# Patient Record
Sex: Male | Born: 1940 | Race: White | Hispanic: No | Marital: Married
Health system: Southern US, Community
[De-identification: ages and names within clinical notes are randomized; demographics above are authoritative.]

## PROBLEM LIST (undated history)

## (undated) DIAGNOSIS — I10 Essential (primary) hypertension: Secondary | ICD-10-CM

## (undated) DIAGNOSIS — G20A1 Parkinson's disease without dyskinesia, without mention of fluctuations: Secondary | ICD-10-CM

## (undated) DIAGNOSIS — I4891 Unspecified atrial fibrillation: Secondary | ICD-10-CM

## (undated) DIAGNOSIS — R7989 Other specified abnormal findings of blood chemistry: Secondary | ICD-10-CM

## (undated) DIAGNOSIS — G40909 Epilepsy, unspecified, not intractable, without status epilepticus: Secondary | ICD-10-CM

## (undated) DIAGNOSIS — I5020 Unspecified systolic (congestive) heart failure: Secondary | ICD-10-CM

## (undated) DIAGNOSIS — N1832 Chronic kidney disease, stage 3b: Secondary | ICD-10-CM

## (undated) DIAGNOSIS — I2510 Atherosclerotic heart disease of native coronary artery without angina pectoris: Secondary | ICD-10-CM

## (undated) HISTORY — PX: CORONARY ARTERY BYPASS GRAFT: SHX141

---

## 2019-12-05 DIAGNOSIS — I5020 Unspecified systolic (congestive) heart failure: Secondary | ICD-10-CM | POA: Diagnosis present

## 2019-12-05 DIAGNOSIS — I480 Paroxysmal atrial fibrillation: Secondary | ICD-10-CM | POA: Diagnosis present

## 2019-12-05 DIAGNOSIS — G40909 Epilepsy, unspecified, not intractable, without status epilepticus: Secondary | ICD-10-CM

## 2019-12-05 DIAGNOSIS — Z95810 Presence of automatic (implantable) cardiac defibrillator: Secondary | ICD-10-CM | POA: Diagnosis present

## 2019-12-05 DIAGNOSIS — D469 Myelodysplastic syndrome, unspecified: Secondary | ICD-10-CM | POA: Insufficient documentation

## 2019-12-05 DIAGNOSIS — I2510 Atherosclerotic heart disease of native coronary artery without angina pectoris: Secondary | ICD-10-CM | POA: Insufficient documentation

## 2019-12-05 DIAGNOSIS — J449 Chronic obstructive pulmonary disease, unspecified: Secondary | ICD-10-CM | POA: Insufficient documentation

## 2019-12-15 DIAGNOSIS — N2889 Other specified disorders of kidney and ureter: Secondary | ICD-10-CM | POA: Diagnosis present

## 2019-12-15 DIAGNOSIS — N1832 Chronic kidney disease, stage 3b: Secondary | ICD-10-CM | POA: Diagnosis present

## 2019-12-28 ENCOUNTER — Other Ambulatory Visit: Payer: Self-pay

## 2019-12-28 ENCOUNTER — Encounter (HOSPITAL_COMMUNITY): Payer: Self-pay | Admitting: Emergency Medicine

## 2019-12-28 ENCOUNTER — Emergency Department (HOSPITAL_COMMUNITY): Payer: Medicare Other

## 2019-12-28 ENCOUNTER — Emergency Department (HOSPITAL_COMMUNITY)
Admission: EM | Admit: 2019-12-28 | Discharge: 2019-12-28 | Disposition: A | Discharge status: Home / Self Care | Payer: Medicare Other | Attending: Emergency Medicine | Admitting: Emergency Medicine

## 2019-12-28 DIAGNOSIS — J449 Chronic obstructive pulmonary disease, unspecified: Secondary | ICD-10-CM | POA: Insufficient documentation

## 2019-12-28 DIAGNOSIS — I509 Heart failure, unspecified: Secondary | ICD-10-CM | POA: Insufficient documentation

## 2019-12-28 DIAGNOSIS — Z951 Presence of aortocoronary bypass graft: Secondary | ICD-10-CM | POA: Diagnosis not present

## 2019-12-28 DIAGNOSIS — Z20822 Contact with and (suspected) exposure to covid-19: Secondary | ICD-10-CM | POA: Insufficient documentation

## 2019-12-28 DIAGNOSIS — R0602 Shortness of breath: Secondary | ICD-10-CM | POA: Diagnosis not present

## 2019-12-28 DIAGNOSIS — I2510 Atherosclerotic heart disease of native coronary artery without angina pectoris: Secondary | ICD-10-CM | POA: Diagnosis not present

## 2019-12-28 DIAGNOSIS — N189 Chronic kidney disease, unspecified: Secondary | ICD-10-CM | POA: Insufficient documentation

## 2019-12-28 DIAGNOSIS — I480 Paroxysmal atrial fibrillation: Secondary | ICD-10-CM | POA: Diagnosis not present

## 2019-12-28 DIAGNOSIS — Z7901 Long term (current) use of anticoagulants: Secondary | ICD-10-CM | POA: Diagnosis not present

## 2019-12-28 DIAGNOSIS — R079 Chest pain, unspecified: Secondary | ICD-10-CM

## 2019-12-28 LAB — RESPIRATORY PANEL BY RT PCR (FLU A&B, COVID)
Influenza A by PCR: NEGATIVE
Influenza B by PCR: NEGATIVE
SARS Coronavirus 2 by RT PCR: NEGATIVE

## 2019-12-28 LAB — I-STAT CHEM 8, ED
BUN: 24 mg/dL — ABNORMAL HIGH (ref 8–23)
Calcium, Ion: 1.11 mmol/L — ABNORMAL LOW (ref 1.15–1.40)
Chloride: 105 mmol/L (ref 98–111)
Creatinine, Ser: 1.4 mg/dL — ABNORMAL HIGH (ref 0.61–1.24)
Glucose, Bld: 104 mg/dL — ABNORMAL HIGH (ref 70–99)
HCT: 41 % (ref 39.0–52.0)
Hemoglobin: 13.9 g/dL (ref 13.0–17.0)
Potassium: 4.6 mmol/L (ref 3.5–5.1)
Sodium: 140 mmol/L (ref 135–145)
TCO2: 26 mmol/L (ref 22–32)

## 2019-12-28 LAB — CBC WITH DIFFERENTIAL/PLATELET
Abs Immature Granulocytes: 0.03 10*3/uL (ref 0.00–0.07)
Basophils Absolute: 0 10*3/uL (ref 0.0–0.1)
Basophils Relative: 0 %
Eosinophils Absolute: 0.1 10*3/uL (ref 0.0–0.5)
Eosinophils Relative: 1 %
HCT: 40.6 % (ref 39.0–52.0)
Hemoglobin: 13.3 g/dL (ref 13.0–17.0)
Immature Granulocytes: 0 %
Lymphocytes Relative: 11 %
Lymphs Abs: 0.8 10*3/uL (ref 0.7–4.0)
MCH: 34.5 pg — ABNORMAL HIGH (ref 26.0–34.0)
MCHC: 32.8 g/dL (ref 30.0–36.0)
MCV: 105.2 fL — ABNORMAL HIGH (ref 80.0–100.0)
Monocytes Absolute: 0.6 10*3/uL (ref 0.1–1.0)
Monocytes Relative: 8 %
Neutro Abs: 6.2 10*3/uL (ref 1.7–7.7)
Neutrophils Relative %: 80 %
Platelets: 111 10*3/uL — ABNORMAL LOW (ref 150–400)
RBC: 3.86 MIL/uL — ABNORMAL LOW (ref 4.22–5.81)
RDW: 12.2 % (ref 11.5–15.5)
WBC: 7.8 10*3/uL (ref 4.0–10.5)
nRBC: 0 % (ref 0.0–0.2)

## 2019-12-28 LAB — TROPONIN I (HIGH SENSITIVITY)
Troponin I (High Sensitivity): 8 ng/L (ref <18)
Troponin I (High Sensitivity): 8 ng/L (ref <18)

## 2019-12-28 NOTE — ED Provider Notes (Signed)
[facility] EMERGENCY DEPARTMENT Provider Note   CSN: [id number] Arrival date & time: 12/28/19  8502     History Chief Complaint  Patient presents with   Chest Pain    Casey Osborne is a 79 y.o. male.  The history is provided by the patient and medical records.  Chest Pain   79 y.o. M with history of PAF on xarelto, CHF, ICD in place ([employer]), CAD s/p CABG x10 and stenting x2, COPD, CKD, MDS, presenting to the ED for chest pain.  States he started last night around 10:30 PM, described it as more of a "discomfort".  Initially it was central in nature but then began radiating up towards left shoulder and into the left side of the neck.  He reports some associated shortness of breath, nausea and feeling weak.  States he has taken SL NTG with some mild improvement.  Patient states he does not currently have a cardiologist in [place], just moved here from [place] 1 month ago. He did make an appt with cardiologist at [facility] but cannot see them until next month.  He stated LHC in 2013 which resulted in the 2 new stents.  He is not a smoker.  No cough, fever, recent illness.  Has been fully vaccinated for covid with booster.  History reviewed. No pertinent past medical history.  There are no problems to display for this patient.   History reviewed. No pertinent surgical history.     History reviewed. No pertinent family history.  Social History   Tobacco Use   Smoking status: Not on file  Substance Use Topics   Alcohol use: Not on file   Drug use: Not on file    Home Medications Prior to Admission medications   Not on File    Allergies    Ciprofloxacin  Review of Systems   Review of Systems  Cardiovascular: Positive for chest pain.  All other systems reviewed and are negative.   Physical Exam Updated Vital Signs BP 106/68 (BP Location: Right Arm)    Pulse 70    Temp 98.1 F (36.7 C) (Oral)    Resp 16    Ht 6' (1.829 m)    Wt 76.7 kg    SpO2  100%    BMI 22.92 kg/m   Physical Exam Vitals and nursing note reviewed.  Constitutional:      Appearance: He is well-developed.     Comments: Elderly, NAD  HENT:     Head: Normocephalic and atraumatic.  Eyes:     Conjunctiva/sclera: Conjunctivae normal.     Pupils: Pupils are equal, round, and reactive to light.  Cardiovascular:     Rate and Rhythm: Normal rate and regular rhythm.     Heart sounds: Normal heart sounds.  Pulmonary:     Effort: Pulmonary effort is normal.     Breath sounds: Normal breath sounds. No wheezing or rhonchi.  Chest:     Comments: Midline sternotomy scar ICD right chest Abdominal:     General: Bowel sounds are normal.     Palpations: Abdomen is soft.  Musculoskeletal:        General: Normal range of motion.     Cervical back: Normal range of motion.  Skin:    General: Skin is warm and dry.  Neurological:     Mental Status: He is alert and oriented to person, place, and time.     ED Results / Procedures / Treatments   Labs (all labs ordered  are listed, but only abnormal results are displayed) Labs Reviewed  CBC WITH DIFFERENTIAL/PLATELET - Abnormal; Notable for the following components:      Result Value   RBC 3.86 (*)    MCV 105.2 (*)    MCH 34.5 (*)    Platelets 111 (*)    All other components within normal limits  I-STAT CHEM 8, ED - Abnormal; Notable for the following components:   BUN 24 (*)    Creatinine, Ser 1.40 (*)    Glucose, Bld 104 (*)    Calcium, Ion 1.11 (*)    All other components within normal limits  RESPIRATORY PANEL BY RT PCR (FLU A&B, COVID)  TROPONIN I (HIGH SENSITIVITY)  TROPONIN I (HIGH SENSITIVITY)    EKG EKG Interpretation  Date/Time:  Wednesday December 28 2019 04:39:15 EST Ventricular Rate:  70 PR Interval:    QRS Duration: 197 QT Interval:  486 QTC Calculation: 525 R Axis:   -71 Text Interpretation: Accelerated junctional rhythm pacerspikes Nonspecific IVCD with LAD LVH with secondary  repolarization abnormality Confirmed by Randal Buba, April ([id number]) on 12/28/2019 4:50:07 AM   Radiology DG Chest Portable 1 View  Result Date: 12/28/2019 CLINICAL DATA:  Chest pain EXAM: PORTABLE CHEST 1 VIEW COMPARISON:  None. FINDINGS: Cardiomegaly and aortic tortuosity. There is a dual-chamber ICD from the right. Probable CABG. There is no edema, consolidation, effusion, or pneumothorax. IMPRESSION: No evidence of active disease. Electronically Signed   By: Monte Fantasia M.D.   On: 12/28/2019 05:17    Procedures Procedures (including critical care time)  Medications Ordered in ED Medications - No data to display  ED Course  I have reviewed the triage vital signs and the nursing notes.  Pertinent labs & imaging results that were available during my care of the patient were reviewed by me and considered in my medical decision making (see chart for details).    MDM Rules/Calculators/A&P  79 y.o. M with hx of CABG x10 and stenting x2 in [place] here with chest pain.  Began last evening around 10:30PM, improved with NTG.  States it was not really pain just "discomfort".  Due to see cardiology at [facility] next month.  EKG with paced rhythm, some LVH noted.  No prior for comparison.  Labs pending.  Patient HD stable at this time.    5:50 AM Initial trop is negative at 8.  CXR clear.  VSS. Patient states he is feeling better--- states history of vasospasm before and thinks that may be what happened today.  He is feeling much better now and appears comfortable.  Given his extensive cardiac history, discussed admission for chest pain rule out.  Patient is somewhat hesitant about this as he was preferring to see cardiologist that he is scheduled with at [facility].  He is at least agreeable to stay for delta trop and reassessment so we will plan for that and to monitor closely.  Wife at bedside, she is a former [employer] and is comfortable with this plan.  Care will be signed out to oncoming provider to  follow-up on delta troponin and reassess.  Final Clinical Impression(s) / ED Diagnoses Final diagnoses:  Chest pain in adult    Rx / DC Orders ED Discharge Orders    None       Larene Pickett, PA-C 12/28/19 0631    Palumbo, April, MD 12/28/19 [phone]

## 2019-12-28 NOTE — ED Provider Notes (Signed)
Care assumed from Quincy Carnes, PA-C at shift change with delta troponin pending.   In brief, this patient is a 79 y.o. M with PMH/o PAF (currently on Xarelto), CHF, ICD, CAD s/p CABG and stenting, COPD who presents for evaluation of chest pain that began last night around 10:30 pm. He states it felt like a "discomfort." He had some mild associated SOB and generalized weakness sensation. He took SL NTG at home with minimal improvement. He just moved here from [place] and has arranged for cardiology follow up in [place] but has not yet seen them. Please see note from previous provider for full history/physical exam.     Arranged [facility]  Cp last night ... discomfort  Feels fine now  Cardiac history ... bypass and stents  Doesn't want to be admitted  Delta trop  If trop is positive    Physical Exam  BP 96/63   Pulse 67   Temp 98.1 F (36.7 C) (Oral)   Resp 17   Ht 6' (1.829 m)   Wt 76.7 kg   SpO2 99%   BMI 22.92 kg/m   Physical Exam   NAD, Sitting on bed comfortably   ED Course/Procedures     Procedures  Results for orders placed or performed during the hospital encounter of 12/28/19 (from the past 24 hour(s))  CBC with Differential/Platelet     Status: Abnormal   Collection Time: 12/28/19  4:52 AM  Result Value Ref Range   WBC 7.8 4.0 - 10.5 K/uL   RBC 3.86 (L) 4.22 - 5.81 MIL/uL   Hemoglobin 13.3 13.0 - 17.0 g/dL   HCT 40.6 39 - 52 %   MCV 105.2 (H) 80.0 - 100.0 fL   MCH 34.5 (H) 26.0 - 34.0 pg   MCHC 32.8 30.0 - 36.0 g/dL   RDW 12.2 11.5 - 15.5 %   Platelets 111 (L) 150 - 400 K/uL   nRBC 0.0 0.0 - 0.2 %   Neutrophils Relative % 80 %   Neutro Abs 6.2 1.7 - 7.7 K/uL   Lymphocytes Relative 11 %   Lymphs Abs 0.8 0.7 - 4.0 K/uL   Monocytes Relative 8 %   Monocytes Absolute 0.6 0.1 - 1.0 K/uL   Eosinophils Relative 1 %   Eosinophils Absolute 0.1 0.0 - 0.5 K/uL   Basophils Relative 0 %   Basophils Absolute 0.0 0.0 - 0.1 K/uL   Immature Granulocytes 0 %    Abs Immature Granulocytes 0.03 0.00 - 0.07 K/uL  Respiratory Panel by RT PCR (Flu A&B, Covid) - Nasopharyngeal Swab     Status: None   Collection Time: 12/28/19  4:52 AM   Specimen: Nasopharyngeal Swab  Result Value Ref Range   SARS Coronavirus 2 by RT PCR NEGATIVE NEGATIVE   Influenza A by PCR NEGATIVE NEGATIVE   Influenza B by PCR NEGATIVE NEGATIVE  Troponin I (High Sensitivity)     Status: None   Collection Time: 12/28/19  4:52 AM  Result Value Ref Range   Troponin I (High Sensitivity) 8 <18 ng/L  I-stat chem 8, ED (not at [facility] or [facility])     Status: Abnormal   Collection Time: 12/28/19  5:02 AM  Result Value Ref Range   Sodium 140 135 - 145 mmol/L   Potassium 4.6 3.5 - 5.1 mmol/L   Chloride 105 98 - 111 mmol/L   BUN 24 (H) 8 - 23 mg/dL   Creatinine, Ser 1.40 (H) 0.61 - 1.24 mg/dL   Glucose, Bld  104 (H) 70 - 99 mg/dL   Calcium, Ion 1.11 (L) 1.15 - 1.40 mmol/L   TCO2 26 22 - 32 mmol/L   Hemoglobin 13.9 13.0 - 17.0 g/dL   HCT 41.0 39 - 52 %  Troponin I (High Sensitivity)     Status: None   Collection Time: 12/28/19  6:47 AM  Result Value Ref Range   Troponin I (High Sensitivity) 8 <18 ng/L     MDM    PLAN: Pending delta trop.  Patient offered admission by previous team.  Patient and wife did not want to be admitted at this time but were agreeable for delta troponin.  If delta troponin comes back negative, patient can follow-up with outpatient cardiology.  If troponin changes, will plan for admission for chest pain observation.  MDM:  Chem-8 shows BUN of 24, creatinine 1.40.  CBC shows hemoglobin of 13.3, platelets of 111.  Covid is negative.  His initial troponin was 8.  Delta troponin is 8.  Discussed results with patient. He tells me he has a history of thrombocytopenia and his current platelets are at his baseline.  He states that his pain has improved significantly since he came here in the emergency department.  He does any state that is not completely resolved.  I  discussed with him regarding his work-up, including his delta troponin.  We discussed at length regarding his history and again offered admission for observation and evaluation by cardiology team.  We discussed the risk vs benefits of admission, including further testing, observation, evaluation.  After extensive discussion with both patient and wife, they declined admission and states they would rather go home and be discharged and follow-up with the outpatient cardiology that they have scheduled.  Patient exhibits full medical decision-making capacity and appears clinically sober.  I instructed him to follow-up with outpatient cardiology as they have previously scheduled.  I discussed with patient that if anytime, his symptoms worsen, he can return to the emergency department. Patient had ample opportunity for questions and discussion. All patient's questions were answered with full understanding. Strict return precautions discussed. Patient expresses understanding and agreement to plan.    1. Chest pain in adult     Portions of this note were generated with Dragon dictation software. Dictation errors may occur despite best attempts at proofreading.     Volanda Napoleon, PA-C 12/28/19 9390    Palumbo, April, MD 12/28/19 2348

## 2019-12-28 NOTE — Discharge Instructions (Signed)
As we discussed, it is very important to follow up with your cardiologist that you have scheduled with. I have also provided a referral to [facility].   As we discussed if at any time, you feel like your symptoms are getting worse, you have shortness of breath, vomiting, or any other worsening or concerning symptoms, please return to the Emergency Dept.

## 2019-12-28 NOTE — ED Triage Notes (Signed)
Arrived from home via [facility], patient stated he started having chest pain that started 1030pm center chest that radiates to his left neck. Patient stated he took a nitro at 1036pm, then at 1041pm and they help sum.  Per EMS they gave 324 ASA.  Patient has a cardiac history and stated it felt like the last heart attack he had in 1994.  BP 116/70, 70, 97% on RA.  Patient had both vaccines and booster

## 2020-07-19 ENCOUNTER — Encounter (HOSPITAL_COMMUNITY): Payer: Self-pay

## 2020-07-19 ENCOUNTER — Telehealth (HOSPITAL_COMMUNITY): Payer: Self-pay

## 2020-07-19 NOTE — Telephone Encounter (Signed)
Recv'ed outside referral for CR from Dr. Geanie Berlin. Pt lives in [place].  Attempted to contact pt in regards to CR. LMTCB.  Mailed letter

## 2020-07-30 ENCOUNTER — Telehealth (HOSPITAL_COMMUNITY): Payer: Self-pay

## 2020-07-30 NOTE — Telephone Encounter (Signed)
Pt called and he is not interested in the cardiac rehab program.

## 2021-03-21 IMAGING — DX DG CHEST 1V PORT
1 series · 1 of 1 positions shown · non-contrast
Comparison: None.

CLINICAL DATA: Chest pain

EXAM:
PORTABLE CHEST 1 VIEW

[chest]
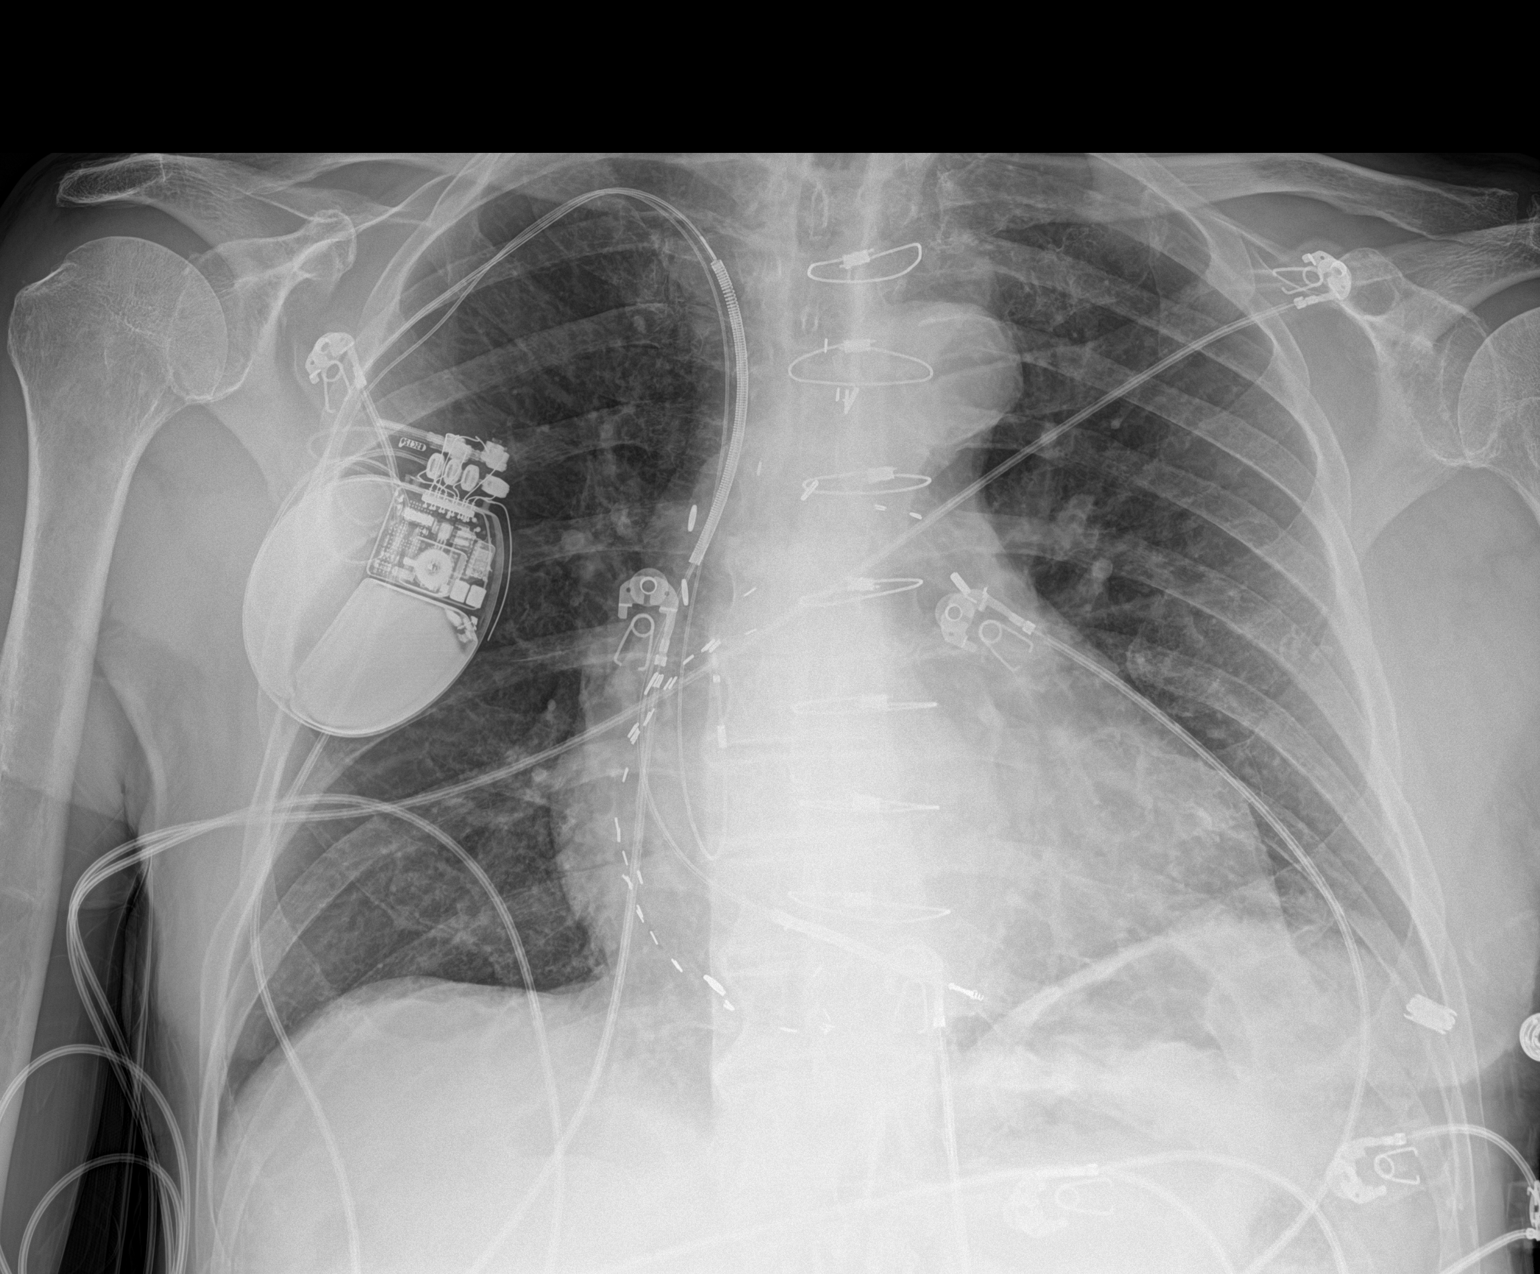

[1 of 1 positions shown; findings below may reference images not displayed]

FINDINGS: Cardiomegaly and aortic tortuosity. There is a dual-chamber ICD from
the right. Probable CABG. There is no edema, consolidation,
effusion, or pneumothorax.
IMPRESSION: No evidence of active disease.

## 2021-09-24 DIAGNOSIS — I255 Ischemic cardiomyopathy: Secondary | ICD-10-CM | POA: Diagnosis present

## 2021-11-11 DIAGNOSIS — Z951 Presence of aortocoronary bypass graft: Secondary | ICD-10-CM

## 2021-11-21 DIAGNOSIS — G20A1 Parkinson's disease without dyskinesia, without mention of fluctuations: Secondary | ICD-10-CM | POA: Insufficient documentation

## 2021-11-21 DIAGNOSIS — I130 Hypertensive heart and chronic kidney disease with heart failure and stage 1 through stage 4 chronic kidney disease, or unspecified chronic kidney disease: Secondary | ICD-10-CM | POA: Insufficient documentation

## 2021-11-21 DIAGNOSIS — I25729 Atherosclerosis of autologous artery coronary artery bypass graft(s) with unspecified angina pectoris: Secondary | ICD-10-CM | POA: Diagnosis present

## 2022-02-07 ENCOUNTER — Other Ambulatory Visit: Payer: Self-pay

## 2022-02-07 ENCOUNTER — Emergency Department (HOSPITAL_COMMUNITY): Payer: Medicare Other

## 2022-02-07 ENCOUNTER — Observation Stay (HOSPITAL_COMMUNITY)
Admission: EM | Admit: 2022-02-07 | Discharge: 2022-02-08 | Disposition: A | Discharge status: Still In Hospital | Payer: Medicare Other | Attending: Internal Medicine | Admitting: Internal Medicine

## 2022-02-07 DIAGNOSIS — Z951 Presence of aortocoronary bypass graft: Secondary | ICD-10-CM | POA: Diagnosis not present

## 2022-02-07 DIAGNOSIS — I5022 Chronic systolic (congestive) heart failure: Secondary | ICD-10-CM | POA: Diagnosis not present

## 2022-02-07 DIAGNOSIS — G40909 Epilepsy, unspecified, not intractable, without status epilepticus: Secondary | ICD-10-CM

## 2022-02-07 DIAGNOSIS — Z950 Presence of cardiac pacemaker: Secondary | ICD-10-CM | POA: Insufficient documentation

## 2022-02-07 DIAGNOSIS — I130 Hypertensive heart and chronic kidney disease with heart failure and stage 1 through stage 4 chronic kidney disease, or unspecified chronic kidney disease: Secondary | ICD-10-CM | POA: Insufficient documentation

## 2022-02-07 DIAGNOSIS — R0789 Other chest pain: Principal | ICD-10-CM | POA: Insufficient documentation

## 2022-02-07 DIAGNOSIS — L03211 Cellulitis of face: Secondary | ICD-10-CM | POA: Diagnosis not present

## 2022-02-07 DIAGNOSIS — I25119 Atherosclerotic heart disease of native coronary artery with unspecified angina pectoris: Secondary | ICD-10-CM | POA: Insufficient documentation

## 2022-02-07 DIAGNOSIS — I5020 Unspecified systolic (congestive) heart failure: Secondary | ICD-10-CM | POA: Diagnosis present

## 2022-02-07 DIAGNOSIS — G20A1 Parkinson's disease without dyskinesia, without mention of fluctuations: Secondary | ICD-10-CM | POA: Diagnosis not present

## 2022-02-07 DIAGNOSIS — Z7901 Long term (current) use of anticoagulants: Secondary | ICD-10-CM | POA: Insufficient documentation

## 2022-02-07 DIAGNOSIS — N1832 Chronic kidney disease, stage 3b: Secondary | ICD-10-CM | POA: Diagnosis not present

## 2022-02-07 DIAGNOSIS — A46 Erysipelas: Secondary | ICD-10-CM | POA: Insufficient documentation

## 2022-02-07 DIAGNOSIS — I25729 Atherosclerosis of autologous artery coronary artery bypass graft(s) with unspecified angina pectoris: Secondary | ICD-10-CM | POA: Diagnosis present

## 2022-02-07 DIAGNOSIS — Z955 Presence of coronary angioplasty implant and graft: Secondary | ICD-10-CM | POA: Insufficient documentation

## 2022-02-07 DIAGNOSIS — R072 Precordial pain: Secondary | ICD-10-CM | POA: Diagnosis present

## 2022-02-07 DIAGNOSIS — I480 Paroxysmal atrial fibrillation: Secondary | ICD-10-CM | POA: Diagnosis not present

## 2022-02-07 DIAGNOSIS — Z95810 Presence of automatic (implantable) cardiac defibrillator: Secondary | ICD-10-CM | POA: Diagnosis present

## 2022-02-07 DIAGNOSIS — R079 Chest pain, unspecified: Principal | ICD-10-CM

## 2022-02-07 DIAGNOSIS — I255 Ischemic cardiomyopathy: Secondary | ICD-10-CM | POA: Diagnosis present

## 2022-02-07 HISTORY — DX: Unspecified atrial fibrillation: I48.91

## 2022-02-07 HISTORY — DX: Chronic kidney disease, stage 3b: N18.32

## 2022-02-07 HISTORY — DX: Other specified abnormal findings of blood chemistry: R79.89

## 2022-02-07 HISTORY — DX: Essential (primary) hypertension: I10

## 2022-02-07 HISTORY — DX: Epilepsy, unspecified, not intractable, without status epilepticus: G40.909

## 2022-02-07 HISTORY — DX: Unspecified systolic (congestive) heart failure: I50.20

## 2022-02-07 HISTORY — DX: Parkinson's disease without dyskinesia, without mention of fluctuations: G20.A1

## 2022-02-07 HISTORY — DX: Atherosclerotic heart disease of native coronary artery without angina pectoris: I25.10

## 2022-02-07 MED ORDER — SODIUM CHLORIDE 0.9 % IV BOLUS
250.0000 mL | Freq: Once | INTRAVENOUS | Status: AC
  Administered 2022-02-07: 250 mL via INTRAVENOUS

## 2022-02-07 MED ORDER — MORPHINE SULFATE (PF) 4 MG/ML IV SOLN
4.0000 mg | Freq: Once | INTRAVENOUS | Status: AC
  Administered 2022-02-07: 4 mg via INTRAVENOUS
  Filled 2022-02-07: qty 1

## 2022-02-07 NOTE — ED Provider Notes (Cosign Needed)
[facility] EMERGENCY DEPARTMENT Provider Note   CSN: [id number] Arrival date & time: 02/07/22  2154     History  Chief Complaint  Patient presents with   Chest Pain    Casey Osborne is a 81 y.o. male, s/p stents and pacemaker, h/o 5 Mis, CAD, HF, MDS, Afib on Xarelto presented today with 2 hr h/o CP. Patient also is being treated for bacterial pharyngitis with Amoxicillin and noticed a rash around his R eye that began after taking Amoxicillin. CP is 8/10 episodic dull substernal that does not radiate. Patient endorsed shortness of breath, weakness, and that activity makes the pain worse. Patient has taken 4 '81mg'$  ASA and had 2 doses of Nitroglycerin but that has not alleviated the pain. Patient denied back pain, L jaw/shoulder pain, vision changes, HA, fever, abd pain, confusion, leg edema, pleuritic CP, positional pain.    Home Medications Prior to Admission medications   Not on File      Allergies    Ciprofloxacin    Review of Systems   Review of Systems  Cardiovascular:  Positive for chest pain.  See HPI  Physical Exam Updated Vital Signs BP 94/62   Pulse 68   Temp 98.9 F (37.2 C) (Oral)   Resp (!) 22   Ht 6' (1.829 m)   Wt 82.6 kg   SpO2 96%   BMI 24.68 kg/m  Physical Exam Vitals reviewed.  Constitutional:      Appearance: He is normal weight.  HENT:     Head: Normocephalic.     Comments: R infraorbital region edema, erythema    Mouth/Throat:     Mouth: Mucous membranes are moist.     Pharynx: Posterior oropharyngeal erythema present. No pharyngeal swelling.  Eyes:     Extraocular Movements: Extraocular movements intact.     Pupils: Pupils are equal, round, and reactive to light.     Comments: No EOM pain  Neck:     Vascular: No carotid bruit.     Comments: R sided edema No crepitus Cardiovascular:     Rate and Rhythm: Normal rate and regular rhythm.     Pulses:          Carotid pulses are 2+ on the right side and 2+ on the left  side.      Radial pulses are 2+ on the right side and 2+ on the left side.       Dorsalis pedis pulses are 2+ on the right side and 2+ on the left side.       Posterior tibial pulses are 2+ on the right side and 2+ on the left side.     Heart sounds: Normal heart sounds.     Comments: On Pacemaker Pulmonary:     Effort: Pulmonary effort is normal.     Breath sounds: Normal breath sounds.  Chest:     Chest wall: No tenderness.  Abdominal:     General: Bowel sounds are normal.     Palpations: Abdomen is soft.     Tenderness: There is no abdominal tenderness.  Musculoskeletal:        General: Normal range of motion.     Cervical back: Normal range of motion. Edema (R side) present.  Skin:    General: Skin is warm.     Capillary Refill: Capillary refill takes less than 2 seconds.     Findings: Erythema (R infraorbital region) present.  Neurological:     General: No focal deficit present.  Mental Status: He is alert and oriented to person, place, and time.  Psychiatric:        Mood and Affect: Mood normal.     ED Results / Procedures / Treatments   Labs (all labs ordered are listed, but only abnormal results are displayed) Labs Reviewed  BASIC METABOLIC PANEL - Abnormal; Notable for the following components:      Result Value   Sodium 134 (*)    Glucose, Bld 123 (*)    Creatinine, Ser 1.79 (*)    Calcium 8.7 (*)    GFR, Estimated 38 (*)    All other components within normal limits  CBC - Abnormal; Notable for the following components:   WBC 10.9 (*)    RBC 3.26 (*)    Hemoglobin 11.5 (*)    HCT 34.5 (*)    MCV 105.8 (*)    MCH 35.3 (*)    Platelets 102 (*)    All other components within normal limits  PROTIME-INR - Abnormal; Notable for the following components:   Prothrombin Time 22.7 (*)    INR 2.0 (*)    All other components within normal limits  TROPONIN I (HIGH SENSITIVITY) - Abnormal; Notable for the following components:   Troponin I (High Sensitivity) 18  (*)    All other components within normal limits  LIPID PANEL  TROPONIN I (HIGH SENSITIVITY)    EKG EKG Interpretation  Date/Time:  Friday February 07 2022 22:01:17 EST Ventricular Rate:  70 PR Interval:    QRS Duration: 194 QT Interval:  480 QTC Calculation: 518 R Axis:   -71 Text Interpretation: ventricularly paced No acute changes Confirmed by Addison Lank [phone]) on 02/08/2022 12:16:16 AM  Radiology CT Soft Tissue Neck W Contrast  Result Date: 02/08/2022 CLINICAL DATA:  Soft tissue infection suspected EXAM: CT NECK WITH CONTRAST TECHNIQUE: Multidetector CT imaging of the neck was performed using the standard protocol following the bolus administration of intravenous contrast. RADIATION DOSE REDUCTION: This exam was performed according to the departmental dose-optimization program which includes automated exposure control, adjustment of the mA and/or kV according to patient size and/or use of iterative reconstruction technique. CONTRAST:  43m OMNIPAQUE IOHEXOL 350 MG/ML SOLN COMPARISON:  None Available. FINDINGS: Pharynx and larynx: Normal. No mass or swelling. Salivary glands: No inflammation, mass, or stone. Thyroid: Normal. Lymph nodes: Negative Vascular: Calcific aortic atherosclerosis and carotid bifurcation atherosclerosis. Limited intracranial: Negative. Visualized orbits: Negative. Mastoids and visualized paranasal sinuses: Clear. Skeleton: No acute or aggressive process. Upper chest: Negative. Other: There is multifocal subcutaneous induration and edema of the right facial soft tissues, both in the right infraorbital area and superficial to the right mandible. No abscess or drainable fluid collection. No clear source. IMPRESSION: 1. Multifocal subcutaneous induration and edema of the right facial soft tissues, consistent with cellulitis. No abscess or drainable fluid collection. No clear source. Aortic Atherosclerosis (ICD10-I70.0). Electronically Signed   By: KUlyses JarredM.D.    On: 02/08/2022 01:56   CT Angio Chest Pulmonary Embolism (PE) W or WO Contrast  Result Date: 02/08/2022 CLINICAL DATA:  Chest pain and shortness of breath. EXAM: CT ANGIOGRAPHY CHEST WITH CONTRAST TECHNIQUE: Multidetector CT imaging of the chest was performed using the standard protocol during bolus administration of intravenous contrast. Multiplanar CT image reconstructions and MIPs were obtained to evaluate the vascular anatomy. RADIATION DOSE REDUCTION: This exam was performed according to the departmental dose-optimization program which includes automated exposure control, adjustment of the mA and/or kV according to  patient size and/or use of iterative reconstruction technique. CONTRAST:  8m OMNIPAQUE IOHEXOL 350 MG/ML SOLN COMPARISON:  None Available. FINDINGS: Cardiovascular: There is a multi lead AICD. There is moderate severity calcification of the aortic arch. The ascending thoracic aorta measures 4.0 cm x 4.2 cm. Satisfactory opacification of the pulmonary arteries to the segmental level. No evidence of pulmonary embolism. There is mild cardiomegaly with marked severity coronary artery calcification. No pericardial effusion. Mediastinum/Nodes: No enlarged mediastinal, hilar, or axillary lymph nodes. Thyroid gland, trachea, and esophagus demonstrate no significant findings. Lungs/Pleura: Mild atelectasis is seen along the posterior aspects of the bilateral lower lobes. There is a small left pleural effusion. No pneumothorax is identified. Upper Abdomen: Noninflamed diverticula are seen along the splenic flexure. Musculoskeletal: Multiple sternal wires are noted. Multilevel degenerative changes seen throughout the thoracic spine. Review of the MIP images confirms the above findings. IMPRESSION: 1. No evidence of pulmonary embolism. 2. Mild bilateral lower lobe atelectasis with a small left pleural effusion. 3. Mild cardiomegaly with marked severity coronary artery calcification. 4. Colonic  diverticulosis. 5. Aortic atherosclerosis. Aortic Atherosclerosis (ICD10-I70.0). Electronically Signed   By: TVirgina NorfolkM.D.   On: 02/08/2022 01:55   DG Chest Port 1 View  Result Date: 02/07/2022 CLINICAL DATA:  Chest pain, dyspnea EXAM: PORTABLE CHEST 1 VIEW COMPARISON:  12/28/2019 FINDINGS: Mild left basilar atelectasis. Lungs are otherwise clear. No pneumothorax or pleural effusion. Status post probable coronary artery bypass grafting. Mild cardiomegaly is stable. Right subclavian pacemaker defibrillator is unchanged. Pulmonary vascularity is normal. No acute bone abnormality. IMPRESSION: 1. Mild left basilar atelectasis. Electronically Signed   By: AFidela SalisburyM.D.   On: 02/07/2022 23:34    Procedures .Critical Care  Performed by: SChuck Hint PA-C Authorized by: SChuck Hint PA-C   Critical care provider statement:    Critical care time (minutes):  40   Critical care time was exclusive of:  Separately billable procedures and treating other patients   Critical care was necessary to treat or prevent imminent or life-threatening deterioration of the following conditions:  Cardiac failure and circulatory failure   Critical care was time spent personally by me on the following activities:  Ordering and performing treatments and interventions, ordering and review of laboratory studies, ordering and review of radiographic studies, pulse oximetry, re-evaluation of patient's condition, review of old charts, obtaining history from patient or surrogate, examination of patient, evaluation of patient's response to treatment, discussions with consultants and development of treatment plan with patient or surrogate   I assumed direction of critical care for this patient from another provider in my specialty: no     Care discussed with: admitting provider      Medications Ordered in ED Medications  cefTRIAXone (ROCEPHIN) 1 g in sodium chloride 0.9 % 100 mL IVPB (has no administration  in time range)  levETIRAcetam (KEPPRA) tablet 1,500 mg (has no administration in time range)  isosorbide mononitrate (IMDUR) 24 hr tablet 60 mg (has no administration in time range)  sacubitril-valsartan (ENTRESTO) 49-51 mg per tablet (has no administration in time range)  carvedilol (COREG) tablet 3.125 mg (has no administration in time range)  amiodarone (PACERONE) tablet 100 mg (has no administration in time range)  furosemide (LASIX) tablet 20 mg (has no administration in time range)  levothyroxine (SYNTHROID) tablet 75 mcg (has no administration in time range)  pantoprazole (PROTONIX) EC tablet 80 mg (has no administration in time range)  ranolazine (RANEXA) 12 hr tablet 1,000 mg (has no administration  in time range)  potassium chloride (KLOR-CON M) CR tablet 10 mEq (has no administration in time range)  atorvastatin (LIPITOR) tablet 20 mg (has no administration in time range)  acetaminophen (TYLENOL) tablet 650 mg (has no administration in time range)    Or  acetaminophen (TYLENOL) suppository 650 mg (has no administration in time range)  ondansetron (ZOFRAN) tablet 4 mg (has no administration in time range)    Or  ondansetron (ZOFRAN) injection 4 mg (has no administration in time range)  heparin ADULT infusion 100 units/mL (25000 units/212m) (has no administration in time range)  clopidogrel (PLAVIX) tablet 75 mg (has no administration in time range)  morphine (PF) 4 MG/ML injection 4 mg (4 mg Intravenous Given 02/07/22 2323)  sodium chloride 0.9 % bolus 250 mL (0 mLs Intravenous Stopped 02/07/22 2346)  iohexol (OMNIPAQUE) 350 MG/ML injection 75 mL (75 mLs Intravenous Contrast Given 02/08/22 0139)  ceFEPIme (MAXIPIME) 2 g in sodium chloride 0.9 % 100 mL IVPB (0 g Intravenous Stopped 02/08/22 0510)    ED Course/ Medical Decision Making/ A&P Clinical Course as of 02/08/22 0521  Sat Feb 08, 2022  0218 Creatinine(!): 1.79 [JS]  04008Per cardiology notes on 01/06/22 - "He also was  found to have a significant ostial lesion in the vein graft to the LAD. His angina is much improved following the initial stent but he continues to have some minor episodes with exertion. Based on this after having reviewed the images, I think elective stenting of this ostium vein graft should be considered."  Per wife, this was supposed to happen on 02/11/22 and was moved to mid January. [KH]  0239 Pain has improved from 8/10 down to 4/10 following morphine. Patient declines additional medication for pain. Pending delta troponin. [[QP]  6195Spoke with Cardiology (Marcelle Smiling who will see patient in consultation. Recommend medicine admission as well as starting patient on heparin given chronic anticoagulation at baseline; hold Xarelto. [KH]    Clinical Course User Index [JS] SChuck Hint PA-C [KH] HAntonietta Breach PA-C                           Medical Decision Making  DVanetta [email or web address].[email or web address]. presented today for 2hr h/o CP. Working differential at this time includes, but is not limited to, ACS, Orbital Cellulitis/Preseptal Cellulitis, PE, Aortic Dissection, TPNX.  Review of prior external notes: 01/06/22 Office Visit, 01/25/20 Office Visit  Unique Tests and My Interpretation: - ECG: rate is paced, LBBB not present on previous ECG - CXR: cardiomegaly, L basilar atelectasis, pacemaker noted - Troponin: 16, 18 - CBC: unremarkable, similar to labs on 01/21/22 - BMP: unremarkable, similar to labs on 11/12/21 - PT/INR: elevated 22.7, 2.0 respectively - CT Soft Tissue Neck w/Contrast: edema present without signs of bony involvement. Most likely cellulitis - CTPA: negative for PE   Discussion with Independent Historian: Wife  Discussion of Management of Tests: Cardiology (Marcelle Smiling MD): Recommended admission to hospitalist along with starting heparin on admission Hospitalist: JJennette Kettleaccepted patient for admission  Risk: High - hospitalization or escalation of hospital-level care  Risk  Stratification Score HEART: 8 (highly suspicious story, non-specific repolarization, >65, >3RF, trop 1-3x normal limit) [place]: 2 (hemoptysis, h/o malignancy)  Previous Cath: 11/11/2021: "Prox LAD to Mid LAD lesion is 100% stenosed" Previous Stress: 2019 and negative per patient in chart review (01/25/20 office visit)  Staffed with KAntonietta Breach PA-C and PAddison Lank MD  R/o DDx: - ACS: no  elevated trop, EKG does not show signs of ischemia - PE: CTPA negative - Orbital/Preseptal Cellulitis: negative on CT and no EOM pain - Aortic Dissection: all pulses were 2+ without delay, CP did not radiate to back - TPNX: lungs CTAB, CXR had bronchovascular markings making this unlikely  Plan: Patient has been given 4 mg IV Morphine for pain management as ASA and Nitro have not managed pain. NS bolus was given to maintain hydration. Patient's temp is 100.82F however the rest of VS are normal. Patient will be monitored while we continue workup. Labs/imaging was ordered to further evaluation cause of CP.   Labs/imaging were unconcerning for the causes listed above. Upon recheck at 0240 patient stated his CP had decreased from 8/10 to 4/10 and does not feel like he needs anymore meds at this time.  Cardiology Marcelle Smiling, MD) was consulted and recommended admission to hospitalist. Hospitalist Jennette Kettle accepted patient for admission.  Most likely diagnosis at this time is Erysipelas with unspecified Chest Pain. The best course of action would be to give antibiotics for erysipelas treatment and admitting due to patient's high risk CV hx. Xarelto will be held at this time in case elective stenting does occur.  Final Clinical Impression(s) / ED Diagnoses Final diagnoses:  Chest pain, unspecified type  Erysipelas    Rx / DC Orders ED Discharge Orders     None         Chuck Hint, [place] 02/08/22 0522

## 2022-02-07 NOTE — ED Notes (Signed)
Pt reporting chest pain is now 6/10. ED Provider at bedside.

## 2022-02-07 NOTE — ED Triage Notes (Signed)
Pt bib [facility] from home c/o chespt pain that started about an hour ago. Significant cardiac hx including 5 Mis, pacemaker, EKG showing paced rhythm. Pt took ASA and 2 nitro prior to EMS arrival. Pt also experiencing facial swelling that started this afternoon, went to pcp and was prescribed amoxicillin. Took first dose about an hour before chest pain began. Reports SOB and general malaise past 4 days.   BP 110/70 HR 70 RR 16 SPO2 97% RA

## 2022-02-08 ENCOUNTER — Emergency Department (HOSPITAL_COMMUNITY): Payer: Medicare Other

## 2022-02-08 ENCOUNTER — Encounter (HOSPITAL_COMMUNITY): Payer: Self-pay | Admitting: Internal Medicine

## 2022-02-08 DIAGNOSIS — Z951 Presence of aortocoronary bypass graft: Secondary | ICD-10-CM | POA: Diagnosis not present

## 2022-02-08 DIAGNOSIS — A46 Erysipelas: Secondary | ICD-10-CM

## 2022-02-08 DIAGNOSIS — N1832 Chronic kidney disease, stage 3b: Secondary | ICD-10-CM | POA: Diagnosis not present

## 2022-02-08 DIAGNOSIS — I255 Ischemic cardiomyopathy: Secondary | ICD-10-CM

## 2022-02-08 DIAGNOSIS — I25729 Atherosclerosis of autologous artery coronary artery bypass graft(s) with unspecified angina pectoris: Secondary | ICD-10-CM | POA: Diagnosis not present

## 2022-02-08 DIAGNOSIS — I480 Paroxysmal atrial fibrillation: Secondary | ICD-10-CM

## 2022-02-08 DIAGNOSIS — Z95810 Presence of automatic (implantable) cardiac defibrillator: Secondary | ICD-10-CM

## 2022-02-08 DIAGNOSIS — I5020 Unspecified systolic (congestive) heart failure: Secondary | ICD-10-CM

## 2022-02-08 DIAGNOSIS — I25119 Atherosclerotic heart disease of native coronary artery with unspecified angina pectoris: Secondary | ICD-10-CM | POA: Diagnosis present

## 2022-02-08 DIAGNOSIS — G40909 Epilepsy, unspecified, not intractable, without status epilepticus: Secondary | ICD-10-CM

## 2022-02-08 LAB — BASIC METABOLIC PANEL
Anion gap: 11 (ref 5–15)
BUN: 23 mg/dL (ref 8–23)
CO2: 23 mmol/L (ref 22–32)
Calcium: 8.7 mg/dL — ABNORMAL LOW (ref 8.9–10.3)
Chloride: 100 mmol/L (ref 98–111)
Creatinine, Ser: 1.79 mg/dL — ABNORMAL HIGH (ref 0.61–1.24)
GFR, Estimated: 38 mL/min — ABNORMAL LOW (ref >60)
Glucose, Bld: 123 mg/dL — ABNORMAL HIGH (ref 70–99)
Potassium: 4 mmol/L (ref 3.5–5.1)
Sodium: 134 mmol/L — ABNORMAL LOW (ref 135–145)

## 2022-02-08 LAB — CBC
HCT: 34.5 % — ABNORMAL LOW (ref 39.0–52.0)
Hemoglobin: 11.5 g/dL — ABNORMAL LOW (ref 13.0–17.0)
MCH: 35.3 pg — ABNORMAL HIGH (ref 26.0–34.0)
MCHC: 33.3 g/dL (ref 30.0–36.0)
MCV: 105.8 fL — ABNORMAL HIGH (ref 80.0–100.0)
Platelets: 102 10*3/uL — ABNORMAL LOW (ref 150–400)
RBC: 3.26 MIL/uL — ABNORMAL LOW (ref 4.22–5.81)
RDW: 13.1 % (ref 11.5–15.5)
WBC: 10.9 10*3/uL — ABNORMAL HIGH (ref 4.0–10.5)
nRBC: 0 % (ref 0.0–0.2)

## 2022-02-08 LAB — TROPONIN I (HIGH SENSITIVITY)
Troponin I (High Sensitivity): 16 ng/L (ref <18)
Troponin I (High Sensitivity): 18 ng/L — ABNORMAL HIGH (ref <18)

## 2022-02-08 LAB — PROTIME-INR
INR: 2 — ABNORMAL HIGH (ref 0.8–1.2)
Prothrombin Time: 22.7 seconds — ABNORMAL HIGH (ref 11.4–15.2)

## 2022-02-08 LAB — LIPID PANEL
Cholesterol: 134 mg/dL (ref 0–200)
HDL: 41 mg/dL (ref >40)
LDL Cholesterol: 77 mg/dL (ref 0–99)
Total CHOL/HDL Ratio: 3.3 RATIO
Triglycerides: 81 mg/dL (ref <150)
VLDL: 16 mg/dL (ref 0–40)

## 2022-02-08 MED ORDER — LEVETIRACETAM 500 MG PO TABS
1500.0000 mg | ORAL_TABLET | Freq: Two times a day (BID) | ORAL | Status: DC
  Administered 2022-02-08: 1500 mg via ORAL
  Filled 2022-02-08: qty 3

## 2022-02-08 MED ORDER — POTASSIUM CHLORIDE CRYS ER 10 MEQ PO TBCR
10.0000 meq | EXTENDED_RELEASE_TABLET | Freq: Every day | ORAL | Status: DC
  Administered 2022-02-08: 10 meq via ORAL
  Filled 2022-02-08: qty 1

## 2022-02-08 MED ORDER — CARVEDILOL 3.125 MG PO TABS
3.1250 mg | ORAL_TABLET | Freq: Two times a day (BID) | ORAL | Status: DC
  Administered 2022-02-08: 3.125 mg via ORAL
  Filled 2022-02-08: qty 1

## 2022-02-08 MED ORDER — CARBOXYMETHYLCELLULOSE SODIUM 1 % OP SOLN: 1.0000 [drp] | Freq: Three times a day (TID) | OPHTHALMIC | 5 refills | Status: DC

## 2022-02-08 MED ORDER — ONDANSETRON HCL 4 MG/2ML IJ SOLN: 4.0000 mg | Freq: Four times a day (QID) | INTRAMUSCULAR | Status: DC | PRN

## 2022-02-08 MED ORDER — AMOXICILLIN-POT CLAVULANATE 875-125 MG PO TABS: 1.0000 | ORAL_TABLET | Freq: Two times a day (BID) | ORAL | 0 refills | Status: AC

## 2022-02-08 MED ORDER — AMIODARONE HCL 200 MG PO TABS: 100.0000 mg | ORAL_TABLET | Freq: Every day | ORAL | Status: DC

## 2022-02-08 MED ORDER — ATORVASTATIN CALCIUM 10 MG PO TABS: 20.0000 mg | ORAL_TABLET | Freq: Every day | ORAL | Status: DC

## 2022-02-08 MED ORDER — SACUBITRIL-VALSARTAN 49-51 MG PO TABS
1.0000 | ORAL_TABLET | Freq: Two times a day (BID) | ORAL | Status: DC
  Administered 2022-02-08: 1 via ORAL
  Filled 2022-02-08: qty 1

## 2022-02-08 MED ORDER — FUROSEMIDE 20 MG PO TABS
20.0000 mg | ORAL_TABLET | Freq: Every day | ORAL | Status: DC
  Administered 2022-02-08: 20 mg via ORAL
  Filled 2022-02-08: qty 1

## 2022-02-08 MED ORDER — CLOPIDOGREL BISULFATE 75 MG PO TABS
75.0000 mg | ORAL_TABLET | Freq: Every day | ORAL | Status: DC
  Administered 2022-02-08: 75 mg via ORAL
  Filled 2022-02-08: qty 1

## 2022-02-08 MED ORDER — AMOXICILLIN-POT CLAVULANATE 875-125 MG PO TABS: 1.0000 | ORAL_TABLET | Freq: Two times a day (BID) | ORAL | 0 refills | Status: DC

## 2022-02-08 MED ORDER — ACETAMINOPHEN 650 MG RE SUPP: 650.0000 mg | Freq: Four times a day (QID) | RECTAL | Status: DC | PRN

## 2022-02-08 MED ORDER — ACETAMINOPHEN 325 MG PO TABS: 650.0000 mg | ORAL_TABLET | Freq: Four times a day (QID) | ORAL | Status: DC | PRN

## 2022-02-08 MED ORDER — SODIUM CHLORIDE 0.9 % IV SOLN
1.0000 g | INTRAVENOUS | Status: DC
  Administered 2022-02-08: 1 g via INTRAVENOUS
  Filled 2022-02-08: qty 10

## 2022-02-08 MED ORDER — LEVOTHYROXINE SODIUM 75 MCG PO TABS
75.0000 ug | ORAL_TABLET | Freq: Every day | ORAL | Status: DC
  Administered 2022-02-08: 75 ug via ORAL
  Filled 2022-02-08: qty 3

## 2022-02-08 MED ORDER — IOHEXOL 350 MG/ML SOLN
75.0000 mL | Freq: Once | INTRAVENOUS | Status: AC | PRN
  Administered 2022-02-08: 75 mL via INTRAVENOUS

## 2022-02-08 MED ORDER — ONDANSETRON HCL 4 MG PO TABS: 4.0000 mg | ORAL_TABLET | Freq: Four times a day (QID) | ORAL | Status: DC | PRN

## 2022-02-08 MED ORDER — HEPARIN (PORCINE) 25000 UT/250ML-% IV SOLN
1200.0000 [IU]/h | INTRAVENOUS | Status: DC
  Administered 2022-02-08: 1200 [IU]/h via INTRAVENOUS
  Filled 2022-02-08: qty 250

## 2022-02-08 MED ORDER — PANTOPRAZOLE SODIUM 40 MG PO TBEC
80.0000 mg | DELAYED_RELEASE_TABLET | Freq: Every day | ORAL | Status: DC
  Administered 2022-02-08: 80 mg via ORAL
  Filled 2022-02-08: qty 2

## 2022-02-08 MED ORDER — RANOLAZINE ER 500 MG PO TB12
1000.0000 mg | ORAL_TABLET | Freq: Two times a day (BID) | ORAL | Status: DC
  Administered 2022-02-08: 1000 mg via ORAL
  Filled 2022-02-08: qty 2

## 2022-02-08 MED ORDER — SODIUM CHLORIDE 0.9 % IV SOLN
2.0000 g | Freq: Once | INTRAVENOUS | Status: AC
  Administered 2022-02-08: 2 g via INTRAVENOUS
  Filled 2022-02-08: qty 12.5

## 2022-02-08 MED ORDER — ISOSORBIDE MONONITRATE ER 30 MG PO TB24
60.0000 mg | ORAL_TABLET | Freq: Every day | ORAL | Status: DC
  Administered 2022-02-08: 60 mg via ORAL
  Filled 2022-02-08: qty 2

## 2022-02-08 MED ORDER — CARBOXYMETHYLCELLULOSE SODIUM 1 % OP SOLN: 1.0000 [drp] | Freq: Three times a day (TID) | OPHTHALMIC | 5 refills | Status: AC

## 2022-02-08 NOTE — Assessment & Plan Note (Addendum)
Cont coreg Cont amiodarone Hold Xarelto, using heparin gtt instead

## 2022-02-08 NOTE — Assessment & Plan Note (Signed)
Continue Imdur, entresto, coreg, lasix

## 2022-02-08 NOTE — Progress Notes (Signed)
This CSW has completed the code 43 for this patient and [place] has delivered it. No further needs at this time.

## 2022-02-08 NOTE — Care Management CC44 (Signed)
Condition Code 44 Documentation Completed  Patient Details  Name: Casey Osborne MRN: [id number] Date of Birth: December 28, 1940   Condition Code 44 given:  Yes Patient signature on Condition Code 44 notice:  Yes Documentation of 2 MD's agreement:  Yes Code 44 added to claim:  Yes    Rodney Booze, LCSW 02/08/2022, 12:23 PM

## 2022-02-08 NOTE — Care Management CC44 (Signed)
Condition Code 44 Documentation Completed  Patient Details  Name: Casey Osborne MRN: [id number] Date of Birth: Jul 03, 1940   Condition Code 44 given:  Yes Patient signature on Condition Code 44 notice:  Yes Documentation of 2 MD's agreement:  Yes Code 44 added to claim:  Yes    Rodney Booze, LCSW 02/08/2022, 12:24 PM

## 2022-02-08 NOTE — Assessment & Plan Note (Signed)
Creat today of 1.79, fairly close to the 1.6 baseline seen in recent months.

## 2022-02-08 NOTE — H&P (Signed)
History and Physical    Patient: Casey Osborne QMG:[id number] DOB: 1940/11/10 DOA: 02/07/2022 DOS: the patient was seen and examined on 02/08/2022 PCP: [name], MD  Patient coming from: Home  Chief Complaint:  Chief Complaint  Patient presents with   Chest Pain   HPI: Casey Osborne is a 81 y.o. male with medical history significant of CAD, HFrEF, AICD, a.fib on xarelto, seizure disorder.  Multiple MIs in past, 2 CABG surgeries, multiple stents.  Most recent LHC was at [name] in Oct 2023.  Stent to SVT to RCA.  Also had ostial lesion in SVG to LAD but didn't stent at that time, planned to stent in future if anginal symptoms got worse again.  Pt with persistent anginal symptoms.  Saw [facility] cards on 11/27: they felt he probably should have elective stenting to ostium of SVG to LAD.  Was scheduled for this in Jan.  Presents to ED today with worsening anginal symptoms.  CP is 8/10 episodic dull substernal that does not radiate. Patient endorsed shortness of breath, weakness, and that activity makes the pain worse. Patient has taken 4 '81mg'$  ASA and had 2 doses of Nitroglycerin but that has not alleviated the pain. Patient denied back pain, L jaw/shoulder pain, vision changes, HA, fever, abd pain, confusion, leg edema, pleuritic CP, positional pain.   Also has sore throat and rash around R eye that began recently.   Review of Systems: As mentioned in the history of present illness. All other systems reviewed and are negative. Past Medical History:  Diagnosis Date   A-fib ([name])    CAD (coronary artery disease)    HFrEF (heart failure with reduced ejection fraction) (HCC)    HTN (hypertension)    Low testosterone    Parkinson's disease    Seizure disorder ([name])    Stage 3b chronic kidney disease (CKD) ([name])    Past Surgical History:  Procedure Laterality Date   CORONARY ARTERY BYPASS GRAFT     CABG 1984, redo 1997   Social History:  has no history on file for tobacco  use, alcohol use, and drug use.  Allergies  Allergen Reactions   Ciprofloxacin     Cause blisters on lips and face    No family history on file.  Prior to Admission medications   Not on File    Physical Exam: Vitals:   02/08/22 0100 02/08/22 0115 02/08/22 0355 02/08/22 0356  BP: 111/67 110/66  94/62  Pulse: 70 70  68  Resp: (!) 25 (!) 21  (!) 22  Temp:   98.9 F (37.2 C)   TempSrc:   Oral   SpO2: 93% 94%  96%  Weight:      Height:       Constitutional: NAD, calm, comfortable Eyes: Swelling and erythema below R eye, no pain with EOM ENMT: Posterior pharyngeal erythema. Neck: normal, supple, no masses, no thyromegaly Respiratory: clear to auscultation bilaterally, no wheezing, no crackles. Normal respiratory effort. No accessory muscle use.  Cardiovascular: Regular rate and rhythm, no murmurs / rubs / gallops. No extremity edema. 2+ pedal pulses. No carotid bruits.  Abdomen: no tenderness, no masses palpated. No hepatosplenomegaly. Bowel sounds positive.  Musculoskeletal: no clubbing / cyanosis. No joint deformity upper and lower extremities. Good ROM, no contractures. Normal muscle tone.  Skin: no rashes, lesions, ulcers. No induration Neurologic: CN 2-12 grossly intact. Sensation intact, DTR normal. Strength 5/5 in all 4.  Psychiatric: Normal judgment and insight. Alert and oriented x 3. Normal  mood.   Data Reviewed:    Trops 16 and 18     Latest Ref Rng & Units 02/07/2022   10:07 PM 12/28/2019    5:02 AM 12/28/2019    4:52 AM  CBC  WBC 4.0 - 10.5 K/uL 10.9   7.8   Hemoglobin 13.0 - 17.0 g/dL 11.5  13.9  13.3   Hematocrit 39.0 - 52.0 % 34.5  41.0  40.6   Platelets 150 - 400 K/uL 102   111       Latest Ref Rng & Units 02/07/2022   10:07 PM 12/28/2019    5:02 AM  CMP  Glucose 70 - 99 mg/dL 123  104   BUN 8 - 23 mg/dL 23  24   Creatinine 0.61 - 1.24 mg/dL 1.79  1.40   Sodium 135 - 145 mmol/L 134  140   Potassium 3.5 - 5.1 mmol/L 4.0  4.6   Chloride 98 -  111 mmol/L 100  105   CO2 22 - 32 mmol/L 23    Calcium 8.9 - 10.3 mg/dL 8.7     CT neck: IMPRESSION: 1. Multifocal subcutaneous induration and edema of the right facial soft tissues, consistent with cellulitis. No abscess or drainable fluid collection. No clear source.  CTA chest: IMPRESSION: 1. No evidence of pulmonary embolism. 2. Mild bilateral lower lobe atelectasis with a small left pleural effusion. 3. Mild cardiomegaly with marked severity coronary artery calcification. 4. Colonic diverticulosis. 5. Aortic atherosclerosis.  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 Not likely to happen today though (weekend), therefore will let pt eat Use heparin gtt instead of Xarelto for A.Fib Tele monitor Cont antianginals (Imdur, and Ranexa) Cont statin (actually I'm a bit surprised that he's only on '20mg'$  lipitor and cards hasn't been more aggressive with HLD meds) Check HLD Candidate for Dr. Debara Pickett to see given extent of history of CAD? Continue Plavix  Hx of CABG H/o 2 CABG surgeries, first in 1984, second in 1997  Seizure disorder ([name]) Cont home Keppra  Paroxysmal atrial fibrillation ([name]) Cont coreg Cont amiodarone Hold Xarelto, using heparin gtt instead  Heart failure with reduced ejection fraction (HCC) Continue Imdur, entresto, coreg, lasix  Chronic renal impairment, stage 3b (HCC) Creat today of 1.79, fairly close to the 1.6 baseline seen in recent months.      Advance Care Planning:   Code Status: DNR Confirmed with patient  Consults: Cardiology Dr. Marcelle Smiling  Family Communication: Wife at bedside  Severity of Illness: The appropriate patient status for this patient is INPATIENT. Inpatient status is judged to be reasonable and necessary in order to provide the required intensity of service to  ensure the patient's safety. The patient's presenting symptoms, physical exam findings, and initial radiographic and laboratory data in the context of their chronic comorbidities is felt to place them at high risk for further clinical deterioration. Furthermore, it is not anticipated that the patient will be medically stable for discharge from the hospital within 2 midnights of admission.   * I certify that at the point of admission it is my clinical judgment that the patient will require inpatient hospital care spanning beyond 2 midnights from the point of admission due to high intensity of service, high risk for further deterioration and high frequency of surveillance required.*  Author: Etta Quill., DO 02/08/2022 5:23 AM  For on call review [email or web address].

## 2022-02-08 NOTE — Assessment & Plan Note (Signed)
Cont home [place]

## 2022-02-08 NOTE — Assessment & Plan Note (Signed)
H/o 2 CABG surgeries, first in 1984, second in 1997

## 2022-02-08 NOTE — Assessment & Plan Note (Addendum)
Cards going to discuss, plan is for [facility] and possible stent to SVG to LAD here. Not likely to happen today though (weekend), therefore will let pt eat Use heparin gtt instead of Xarelto for A.Fib Tele monitor Cont antianginals (Imdur, and Ranexa) Cont statin (actually I'm a bit surprised that he's only on '20mg'$  lipitor and cards hasn't been more aggressive with HLD meds) Check HLD Candidate for Dr. Debara Pickett to see given extent of history of CAD? Continue Plavix

## 2022-02-08 NOTE — Care Management Obs Status (Signed)
[place] NOTIFICATION   Patient Details  Name: Casey Osborne MRN: [id number] Date of Birth: 1941-01-04   Medicare Observation Status Notification Given:  Yes    Rodney Booze, LCSW 02/08/2022, 12:23 PM

## 2022-02-08 NOTE — Progress Notes (Signed)
Rounding Note    Patient Name: Casey Osborne Date of Encounter: 02/08/2022  [place] Cardiologist: None , [facility]  Subjective   Mr. Steenson still has ongoing facial infection. Bacterial ? On ceftriaxone. Varicella? Otherwise, he denies CP.  Inpatient Medications    Scheduled Meds:  amiodarone  100 mg Oral QHS   atorvastatin  20 mg Oral QHS   carvedilol  3.125 mg Oral BID WC   clopidogrel  75 mg Oral Daily   furosemide  20 mg Oral Daily   isosorbide mononitrate  60 mg Oral Daily   levETIRAcetam  1,500 mg Oral BID   levothyroxine  75 mcg Oral Q0600   pantoprazole  80 mg Oral Daily   potassium chloride  10 mEq Oral Daily   ranolazine  1,000 mg Oral BID   sacubitril-valsartan  1 tablet Oral BID   Continuous Infusions:  cefTRIAXone (ROCEPHIN)  IV     heparin 1,200 Units/hr (02/08/22 0807)   PRN Meds: acetaminophen **OR** acetaminophen, ondansetron **OR** ondansetron (ZOFRAN) IV   Vital Signs    Vitals:   02/08/22 0715 02/08/22 0745 02/08/22 0806 02/08/22 0830  BP: 93/64 93/62  (!) 95/56  Pulse: 70 70  70  Resp: 18 (!) 28  19  Temp:   99.4 F (37.4 C)   TempSrc:   Oral   SpO2: 97% 97%  97%  Weight:      Height:        Intake/Output Summary (Last 24 hours) at 02/08/2022 0916 Last data filed at 02/08/2022 0510 Gross per 24 hour  Intake 354.39 ml  Output --  Net 354.39 ml      02/07/2022   10:00 PM 12/28/2019    4:42 AM  Last 3 Weights  Weight (lbs) 182 lb 169 lb  Weight (kg) 82.555 kg 76.658 kg      Telemetry    V paced - Personally Reviewed  ECG    V paced - Personally Reviewed  Physical Exam   Vitals:   02/08/22 0806 02/08/22 0830  BP:  (!) 95/56  Pulse:  70  Resp:  19  Temp: 99.4 F (37.4 C)   SpO2:  97%    GEN: No acute distress.   HEENT: conjuntivitis, R maxillary swelling and erythema Neck: No JVD Cardiac: RRR, no murmurs, rubs, or gallops.  Respiratory: Clear to auscultation bilaterally. GI: Soft, nontender,  non-distended  MS: No edema; No deformity. Neuro:  Nonfocal  Psych: Normal affect   Labs    High Sensitivity Troponin:   Recent Labs  Lab 02/07/22 2207 02/08/22 0148  TROPONINIHS 16 18*     Chemistry Recent Labs  Lab 02/07/22 2207  NA 134*  K 4.0  CL 100  CO2 23  GLUCOSE 123*  BUN 23  CREATININE 1.79*  CALCIUM 8.7*  GFRNONAA 38*  ANIONGAP 11    Lipids  Recent Labs  Lab 02/08/22 0532  CHOL 134  TRIG 81  HDL 41  LDLCALC 77  CHOLHDL 3.3    Hematology Recent Labs  Lab 02/07/22 2207  WBC 10.9*  RBC 3.26*  HGB 11.5*  HCT 34.5*  MCV 105.8*  MCH 35.3*  MCHC 33.3  RDW 13.1  PLT 102*   Thyroid No results for input(s): "TSH", "FREET4" in the last 168 hours.  BNPNo results for input(s): "BNP", "PROBNP" in the last 168 hours.  DDimer No results for input(s): "DDIMER" in the last 168 hours.   Radiology    CT Soft Tissue Neck  W Contrast  Result Date: 02/08/2022 CLINICAL DATA:  Soft tissue infection suspected EXAM: CT NECK WITH CONTRAST TECHNIQUE: Multidetector CT imaging of the neck was performed using the standard protocol following the bolus administration of intravenous contrast. RADIATION DOSE REDUCTION: This exam was performed according to the departmental dose-optimization program which includes automated exposure control, adjustment of the mA and/or kV according to patient size and/or use of iterative reconstruction technique. CONTRAST:  2m OMNIPAQUE IOHEXOL 350 MG/ML SOLN COMPARISON:  None Available. FINDINGS: Pharynx and larynx: Normal. No mass or swelling. Salivary glands: No inflammation, mass, or stone. Thyroid: Normal. Lymph nodes: Negative Vascular: Calcific aortic atherosclerosis and carotid bifurcation atherosclerosis. Limited intracranial: Negative. Visualized orbits: Negative. Mastoids and visualized paranasal sinuses: Clear. Skeleton: No acute or aggressive process. Upper chest: Negative. Other: There is multifocal subcutaneous induration and edema  of the right facial soft tissues, both in the right infraorbital area and superficial to the right mandible. No abscess or drainable fluid collection. No clear source. IMPRESSION: 1. Multifocal subcutaneous induration and edema of the right facial soft tissues, consistent with cellulitis. No abscess or drainable fluid collection. No clear source. Aortic Atherosclerosis (ICD10-I70.0). Electronically Signed   By: KUlyses JarredM.D.   On: 02/08/2022 01:56   CT Angio Chest Pulmonary Embolism (PE) W or WO Contrast  Result Date: 02/08/2022 CLINICAL DATA:  Chest pain and shortness of breath. EXAM: CT ANGIOGRAPHY CHEST WITH CONTRAST TECHNIQUE: Multidetector CT imaging of the chest was performed using the standard protocol during bolus administration of intravenous contrast. Multiplanar CT image reconstructions and MIPs were obtained to evaluate the vascular anatomy. RADIATION DOSE REDUCTION: This exam was performed according to the departmental dose-optimization program which includes automated exposure control, adjustment of the mA and/or kV according to patient size and/or use of iterative reconstruction technique. CONTRAST:  715mOMNIPAQUE IOHEXOL 350 MG/ML SOLN COMPARISON:  None Available. FINDINGS: Cardiovascular: There is a multi lead AICD. There is moderate severity calcification of the aortic arch. The ascending thoracic aorta measures 4.0 cm x 4.2 cm. Satisfactory opacification of the pulmonary arteries to the segmental level. No evidence of pulmonary embolism. There is mild cardiomegaly with marked severity coronary artery calcification. No pericardial effusion. Mediastinum/Nodes: No enlarged mediastinal, hilar, or axillary lymph nodes. Thyroid gland, trachea, and esophagus demonstrate no significant findings. Lungs/Pleura: Mild atelectasis is seen along the posterior aspects of the bilateral lower lobes. There is a small left pleural effusion. No pneumothorax is identified. Upper Abdomen: Noninflamed  diverticula are seen along the splenic flexure. Musculoskeletal: Multiple sternal wires are noted. Multilevel degenerative changes seen throughout the thoracic spine. Review of the MIP images confirms the above findings. IMPRESSION: 1. No evidence of pulmonary embolism. 2. Mild bilateral lower lobe atelectasis with a small left pleural effusion. 3. Mild cardiomegaly with marked severity coronary artery calcification. 4. Colonic diverticulosis. 5. Aortic atherosclerosis. Aortic Atherosclerosis (ICD10-I70.0). Electronically Signed   By: ThVirgina Norfolk.D.   On: 02/08/2022 01:55   DG Chest Port 1 View  Result Date: 02/07/2022 CLINICAL DATA:  Chest pain, dyspnea EXAM: PORTABLE CHEST 1 VIEW COMPARISON:  12/28/2019 FINDINGS: Mild left basilar atelectasis. Lungs are otherwise clear. No pneumothorax or pleural effusion. Status post probable coronary artery bypass grafting. Mild cardiomegaly is stable. Right subclavian pacemaker defibrillator is unchanged. Pulmonary vascularity is normal. No acute bone abnormality. IMPRESSION: 1. Mild left basilar atelectasis. Electronically Signed   By: AsFidela Salisbury.D.   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 fraction, chronic systolic congestive  heart failure   POST-PROCEDURE DIAGNOSIS:  Severe native obstructive coronary artery disease  status post CABG with all vein conduit and high-grade stenosis in SVG to  right coronary artery and moderate to moderately severe ostial stenosis in  SVG to LAD, no patent SVG to circumflex, patent SVG to diagonal Lakishia [place]assa  Successful coronary bypass graft intervention of SVG to RCA with DES   PRIMARY/REFERRING CARDIOLOGIST: Marina Goodell MD   PROCEDURES  PERFORMED:   Left heart catheterization  Right heart catheterization  Coronary angiography  Coronary Bypass Graft angiography  Left subclavian artery and left internal mammary artery angiography  Percutaneous coronary intervention of SVG-RCA    TTE 09/25/2021 Mitral Valve: Mitral valve structure is normal. The leaflets are mildly  thickened and exhibit normal excursion.    Mitral Valve: There is moderate posterior annular calcification.    Aortic Valve: The aortic valve is tricuspid. The leaflets are mildly  thickened and exhibit normal excursion.    Aorta: The aortic root is normal in size - 3.2cm. The ascending aorta  is mildly dilated - 3.85 cm.    Left Ventricle: Systolic function is moderately abnormal. EF: 35-40%.    Left Ventricle: Mid inferoseptal, distal inferoseptal, mid  anteroseptal, distal anteroseptal, distal inferior and distal lateral and  apical segments appear severely hypokinetic to akinetic.  Contractility of  other segments, especially basal segments appear relatively preserved.    Left Ventricle: There is moderate concentric hypertrophy.    Left Ventricle: Doppler parameters consistent with moderate diastolic  dysfunction and elevated LA pressure.    Left Atrium: Left atrium is mildly to moderately dilated at 4.500  cmLeft atrium volume index is moderately increased (42-48 mL/m2).    Right Ventricle: Right ventricle size is normal.  Catheter noted in  right-sided chambers likely ICD or pacemaker lead.    Tricuspid Valve: The right ventricular systolic pressure is normal (<36  mmHg).    Patient Profile     Jalon Squier is a 81 y.o. male with a hx of CAD s/p CABG, HFrEF, afib, seizure disorder, ICD who is being seen today for the evaluation of chest pain at the request of emergency department.   Assessment & Plan    CP: has known ostial SVG-LAD disease. His trop is flat. He is chest pain free this AM. We discussed his options of staying or keeping his cath  scheduled at [facility]. We decided keeping his scheduled PCI at [facility] would be the most reasonable decision. Can plan to continue his home medications. From a cardiology standpoint, he can be discharged with FU/intervention planned at [facility] on January 16th.   For questions or updates, please contact [place] Please consult [email or web address] for contact info under        Signed, Janina Mayo, MD  02/08/2022, 9:16 AM

## 2022-02-08 NOTE — Consult Note (Signed)
Cardiology Consultation:   Patient ID: Meril Dray MRN: [id number]; DOB: 1940-10-02  Admit date: 02/07/2022 Date of Consult: 02/08/2022  Primary Care Provider: [name], MD Primary Cardiologist: None  Primary Electrophysiologist:  None    Patient Profile:   Casey Osborne is a 81 y.o. male with a hx of CAD s/p CABG, HFrEF, afib, seizure disorder who is being seen today for the evaluation of chest pain at the request of emergency department.  History of Present Illness:   Casey Osborne  is a 81 y.o. male with a hx of CAD s/p CABG, HFrEF, afib, seizure disorder who presents to the emergency department with worsening chest pain and shortness of breath. He has had CABG in the past and multiple Mis including recent PCI to a vein graft.  He had been following with cardiology at [facility] who noted that they were planning to take him for intervention at the beginning of January for PCI of vein graft to LAD.  The vein graft had disease at the ostium.  He states however he has been feeling poorly over the last 2 weeks and actually called to cancel planned heart catheterization.  The symptoms that he was feeling were mostly upper respiratory infection symptoms including cough, fevers, and shortness of breath with sputum production.  On top of this he developed a right facial infection for which he was being treated with antibiotics.  Throughout these URI/cold symptoms he noted that his angina was present however it was not until the last 24 to 48 hours that it worsened.  Past Medical History:  Diagnosis Date   A-fib [facility])    CAD (coronary artery disease)    HFrEF (heart failure with reduced ejection fraction) (HCC)    HTN (hypertension)    Low testosterone    Parkinson's disease    Seizure disorder (HCC)    Stage 3b chronic kidney disease (CKD) ([place])     Past Surgical History:  Procedure Laterality Date   CORONARY ARTERY BYPASS GRAFT     CABG 1984, redo 1997     Home Medications:   Prior to Admission medications   Not on File    Inpatient Medications: Scheduled Meds:  amiodarone  100 mg Oral QHS   atorvastatin  20 mg Oral QHS   carvedilol  3.125 mg Oral BID WC   clopidogrel  75 mg Oral Daily   furosemide  20 mg Oral Daily   isosorbide mononitrate  60 mg Oral Daily   levETIRAcetam  1,500 mg Oral BID   levothyroxine  75 mcg Oral Q0600   pantoprazole  80 mg Oral Daily   potassium chloride  10 mEq Oral Daily   ranolazine  1,000 mg Oral BID   sacubitril-valsartan  1 tablet Oral BID   Continuous Infusions:  cefTRIAXone (ROCEPHIN)  IV     heparin 1,200 Units/hr (02/08/22 0533)   PRN Meds: acetaminophen **OR** acetaminophen, ondansetron **OR** ondansetron (ZOFRAN) IV  Allergies:    Allergies  Allergen Reactions   Ciprofloxacin     Cause blisters on lips and face    Social History:   Social History   Socioeconomic History   Marital status: Married    Spouse name: Not on file   Number of children: Not on file   Years of education: Not on file   Highest education level: Not on file  Occupational History   Not on file  Tobacco Use   Smoking status: Not on file   Smokeless tobacco: Not on file  Substance and Sexual Activity   Alcohol use: Not on file   Drug use: Not on file   Sexual activity: Not on file  Other Topics Concern   Not on file  Social History Narrative   Not on file   Social Determinants of Health   Financial Resource Strain: Not on file  Food Insecurity: Not on file  Transportation Needs: Not on file  Physical Activity: Not on file  Stress: Not on file  Social Connections: Not on file  Intimate Partner Violence: Not on file    Family History:   No family history on file.   Review of Systems: 12 review of systems negative unless noted in the HPI  Physical Exam/Data:   Vitals:   02/08/22 0545 02/08/22 0600 02/08/22 0615 02/08/22 0630  BP: '95/61 95/61 99/63 '$ 93/62  Pulse: 70 70 70 70  Resp: (!) '7 11 18 10  '$ Temp:       TempSrc:      SpO2: 96% 97% 96% 97%  Weight:      Height:        Intake/Output Summary (Last 24 hours) at 02/08/2022 0650 Last data filed at 02/08/2022 0510 Gross per 24 hour  Intake 354.39 ml  Output --  Net 354.39 ml   Filed Weights   02/07/22 2200  Weight: 82.6 kg   Body mass index is 24.68 kg/m.  General: No acute distress HEENT: Erythema and swelling around his right eye Lymph: no adenopathy Neck: no JVD Endocrine:  No thryomegaly Vascular: No carotid bruits; FA pulses 2+ bilaterally without bruits  Cardiac: Regular rate and rhythm Lungs: Normal work of breathing Abd: soft, nontender, no hepatomegaly  Ext: no edema Musculoskeletal:  No deformities, BUE and BLE strength normal and equal Skin: warm and dry  Neuro:  CNs 2-12 intact, no focal abnormalities noted Psych:  Normal affect    Laboratory Data:  Chemistry Recent Labs  Lab 02/07/22 2207  NA 134*  K 4.0  CL 100  CO2 23  GLUCOSE 123*  BUN 23  CREATININE 1.79*  CALCIUM 8.7*  GFRNONAA 38*  ANIONGAP 11    No results for input(s): "PROT", "ALBUMIN", "AST", "ALT", "ALKPHOS", "BILITOT" in the last 168 hours. Hematology Recent Labs  Lab 02/07/22 2207  WBC 10.9*  RBC 3.26*  HGB 11.5*  HCT 34.5*  MCV 105.8*  MCH 35.3*  MCHC 33.3  RDW 13.1  PLT 102*   Cardiac EnzymesNo results for input(s): "TROPONINI" in the last 168 hours. No results for input(s): "TROPIPOC" in the last 168 hours.  BNPNo results for input(s): "BNP", "PROBNP" in the last 168 hours.  DDimer No results for input(s): "DDIMER" in the last 168 hours.  Radiology/Studies:  CT Soft Tissue Neck W Contrast  Result Date: 02/08/2022 CLINICAL DATA:  Soft tissue infection suspected EXAM: CT NECK WITH CONTRAST TECHNIQUE: Multidetector CT imaging of the neck was performed using the standard protocol following the bolus administration of intravenous contrast. RADIATION DOSE REDUCTION: This exam was performed according to the departmental  dose-optimization program which includes automated exposure control, adjustment of the mA and/or kV according to patient size and/or use of iterative reconstruction technique. CONTRAST:  5m OMNIPAQUE IOHEXOL 350 MG/ML SOLN COMPARISON:  None Available. FINDINGS: Pharynx and larynx: Normal. No mass or swelling. Salivary glands: No inflammation, mass, or stone. Thyroid: Normal. Lymph nodes: Negative Vascular: Calcific aortic atherosclerosis and carotid bifurcation atherosclerosis. Limited intracranial: Negative. Visualized orbits: Negative. Mastoids and visualized paranasal sinuses: Clear. Skeleton: No acute or aggressive  process. Upper chest: Negative. Other: There is multifocal subcutaneous induration and edema of the right facial soft tissues, both in the right infraorbital area and superficial to the right mandible. No abscess or drainable fluid collection. No clear source. IMPRESSION: 1. Multifocal subcutaneous induration and edema of the right facial soft tissues, consistent with cellulitis. No abscess or drainable fluid collection. No clear source. Aortic Atherosclerosis (ICD10-I70.0). Electronically Signed   By: Ulyses Jarred M.D.   On: 02/08/2022 01:56   CT Angio Chest Pulmonary Embolism (PE) W or WO Contrast  Result Date: 02/08/2022 CLINICAL DATA:  Chest pain and shortness of breath. EXAM: CT ANGIOGRAPHY CHEST WITH CONTRAST TECHNIQUE: Multidetector CT imaging of the chest was performed using the standard protocol during bolus administration of intravenous contrast. Multiplanar CT image reconstructions and MIPs were obtained to evaluate the vascular anatomy. RADIATION DOSE REDUCTION: This exam was performed according to the departmental dose-optimization program which includes automated exposure control, adjustment of the mA and/or kV according to patient size and/or use of iterative reconstruction technique. CONTRAST:  10m OMNIPAQUE IOHEXOL 350 MG/ML SOLN COMPARISON:  None Available. FINDINGS:  Cardiovascular: There is a multi lead AICD. There is moderate severity calcification of the aortic arch. The ascending thoracic aorta measures 4.0 cm x 4.2 cm. Satisfactory opacification of the pulmonary arteries to the segmental level. No evidence of pulmonary embolism. There is mild cardiomegaly with marked severity coronary artery calcification. No pericardial effusion. Mediastinum/Nodes: No enlarged mediastinal, hilar, or axillary lymph nodes. Thyroid gland, trachea, and esophagus demonstrate no significant findings. Lungs/Pleura: Mild atelectasis is seen along the posterior aspects of the bilateral lower lobes. There is a small left pleural effusion. No pneumothorax is identified. Upper Abdomen: Noninflamed diverticula are seen along the splenic flexure. Musculoskeletal: Multiple sternal wires are noted. Multilevel degenerative changes seen throughout the thoracic spine. Review of the MIP images confirms the above findings. IMPRESSION: 1. No evidence of pulmonary embolism. 2. Mild bilateral lower lobe atelectasis with a small left pleural effusion. 3. Mild cardiomegaly with marked severity coronary artery calcification. 4. Colonic diverticulosis. 5. Aortic atherosclerosis. Aortic Atherosclerosis (ICD10-I70.0). Electronically Signed   By: TVirgina NorfolkM.D.   On: 02/08/2022 01:55   DG Chest Port 1 View  Result Date: 02/07/2022 CLINICAL DATA:  Chest pain, dyspnea EXAM: PORTABLE CHEST 1 VIEW COMPARISON:  12/28/2019 FINDINGS: Mild left basilar atelectasis. Lungs are otherwise clear. No pneumothorax or pleural effusion. Status post probable coronary artery bypass grafting. Mild cardiomegaly is stable. Right subclavian pacemaker defibrillator is unchanged. Pulmonary vascularity is normal. No acute bone abnormality. IMPRESSION: 1. Mild left basilar atelectasis. Electronically Signed   By: AFidela SalisburyM.D.   On: 02/07/2022 23:34    Assessment and Plan:   Chest pain.  It is likely that this is anginal  chest pain from his significant coronary disease with multiple intervention.  Unfortunately, his disease is very complex and will need further review from our interventional team.  I think that he should make sure that he is stable from an infectious standpoint prior to undergoing cardiac catheterization unless his chest pain continued to progress.  Fortunately his troponins were negative and flat.  Will need to get his films from [facility] to review.  I asked the emergency department to put him on heparin simply for the fact that he is on anticoagulation for atrial fibrillation.  Do not think that he needs anticoagulation for his chest pain.  Continue home meds and treat infection per medicine.  Cardiology will evaluate for intervention.  For questions or updates, please contact [place] Please consult [email or web address] for contact info under     Signed, Doyne Keel, MD  02/08/2022 6:50 AM

## 2022-02-08 NOTE — Progress Notes (Signed)
ANTICOAGULATION CONSULT NOTE - Initial Consult  Pharmacy Consult for Heparin (Xarelto on hold) Indication: chest pain/ACS and atrial fibrillation  Allergies  Allergen Reactions   Ciprofloxacin     Cause blisters on lips and face    Patient Measurements: Height: 6' (182.9 cm) Weight: 82.6 kg (182 lb) IBW/kg (Calculated) : 77.6  Vital Signs: Temp: 98.9 F (37.2 C) (12/30 0355) Temp Source: Oral (12/30 0355) BP: 94/62 (12/30 0356) Pulse Rate: 68 (12/30 0356)  Labs: Recent Labs    02/07/22 2207 02/08/22 0148  HGB 11.5*  --   HCT 34.5*  --   PLT 102*  --   LABPROT 22.7*  --   INR 2.0*  --   CREATININE 1.79*  --   TROPONINIHS 16 18*    Estimated Creatinine Clearance: 35.5 mL/min (A) (by C-G formula based on SCr of 1.79 mg/dL (H)).   Medical History: No past medical history on file.  Assessment: 81 y/o M in the ED with chest pain, starting heparin, on Xarelto for afib, holding for now, last dose 12/28 at 2100, ok to start heparin now. Labs above reviewed. Anticipate using aPTT to dose for now.   Goal of Therapy:  Heparin level 0.3-0.7 units/ml aPTT 66-102 seconds Monitor platelets by anticoagulation protocol: Yes   Plan:  Start heparin at 1200 units/hr 8 hour heparin level and aPTT Daily CBC, heparin level, and aPTT Monitor for bleeding F/U chest pain work-up  Narda Bonds, PharmD, BCPS Clinical Pharmacist Phone: [phone]

## 2022-02-08 NOTE — Discharge Summary (Signed)
Physician Discharge Summary  Casey Osborne WVP:[id number] DOB: 08-Apr-1940 DOA: 02/07/2022  PCP: [name], MD  Admit date: 02/07/2022 Discharge date: 02/08/2022  Admitted From: Home Discharge disposition: Home  Recommendations at discharge:  Follow-up with your cardiologist at [facility] Complete a course of Augmentin.   Brief narrative: Casey Osborne is a 81 y.o. male with PMH significant for CAD/CABG, chronic diastolic CHF, A-fib on Xarelto, hypertension, CKD, Parkinson's disease, seizure disorder. 12/29, patient presented to the ED with complaint of chest pain, shortness of breath.  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  He follows up with cardiology at [facility] and was apparently planned for elective PCI in January. Patient is not feeling poor, weeks with cough, fever, shortness of breath.  Over the last 24 to 48 hours, patient felt worsening chest pain.  He tried 2 doses of nitroglycerin, 4 baby aspirin without any improvement and hence presented to the ED.  In the ED, patient had a temperature 100.3, hemodynamically stable Labs with WC count 10.9, hemoglobin 11.5, platelet 102, INR 2, creatinine 1.79, troponin 16>18. CT angio chest did not show evidence of pulm embolism, or infiltrate. CT soft tissue neck showed multifocal subcutaneous induration and edema of the right facial soft tissues, consistent with cellulitis. No abscess or drainable fluid collection.  Admitted to [facility] Cardiology consult was obtained  Subjective: Patient was seen and examined this morning.  Pleasant, elderly Caucasian male.  Propped up in bed. Not on supplemental oxygen. Denies any chest pain this morning. Patient was noted to have crusting of his right eye and surrounding cellulitis.  No vesicular lesions noted.  Assessment and plan: Right facial cellulitis For history, he was started on a course of Augmentin as an outpatient on the very day of  presentation for right facial cellulitis.  On exam today, I noted crusting of his right eye without improvement in vision.  He has surrounding cellulitis as well involving half of the right side of his face.  On admission, he was started on IV Rocephin. No fever, chills, only mildly elevated. I think he can safely continue oral Augmentin as started previously.  Unable to add ophthalmic antibiotic drops as he is allergic to ciprofloxacin.  I will add teardrops. 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  Started on heparin drip initially. Cardiology follow-up this morning appreciated.  No plan of LHC this admission.  Patient can follow-up with his cardiologist at [facility]. Continue Coreg, Plavix, Imdur, Ranexa and Xarelto as before. Recent Labs    02/07/22 2207 02/08/22 0148  TROPONINIHS 16 18*   Hx of CABG H/o 2 CABG surgeries, first in 1984, second in 5462  Chronic systolic CHF HTN PTA on Coreg, Entresto, Lasix, Imdur.  Continue the same  Paroxysmal atrial fibrillation  Cont Coreg,  Continue Xarelto as before  HLD Continue Lipitor.  CKD 3B Baseline creatinine 1.6 from 3 months ago Recent Labs    02/07/22 2207  BUN 23  CREATININE 1.79*   Seizure disorder  Cont home Keppra  Wounds:  -    Discharge Exam:   Vitals:   02/08/22 1100 02/08/22 1124 02/08/22 1130 02/08/22 1330  BP: 93/63  95/60 91/61  Pulse: 69  69 71  Resp: '14  16 16  '$ Temp:   99.3 F (37.4 C) 97.7 F (36.5 C)  TempSrc:  Oral Oral  SpO2: 93% 95% 95% 96%  Weight:      Height:        Body mass index is 24.68 kg/m.  General exam: Pleasant, elderly Caucasian male.  Not in distress Skin: No rashes, lesions or ulcers. HEENT: Right eye crusting and cellulitis around Lungs: Clear to auscultation bilaterally CVS: Regular rate and rhythm, no murmur GI/Abd soft, nontender, nondistended, bowel  sound present CNS: Alert, awake, oriented x 3 Psychiatry: Mood appropriate Extremities: No pedal edema, no calf tenderness  Follow ups:    Follow-up Information     [name], MD Follow up.   Specialty: Internal Medicine Contact information: [address] [place] [address] [phone]                 Discharge Instructions:   Discharge Instructions     Call MD for:  difficulty breathing, headache or visual disturbances   Complete by: As directed    Call MD for:  extreme fatigue   Complete by: As directed    Call MD for:  hives   Complete by: As directed    Call MD for:  persistant dizziness or light-headedness   Complete by: As directed    Call MD for:  persistant nausea and vomiting   Complete by: As directed    Call MD for:  severe uncontrolled pain   Complete by: As directed    Call MD for:  temperature >100.4   Complete by: As directed    Diet - low sodium heart healthy   Complete by: As directed    Discharge instructions   Complete by: As directed    General discharge instructions: Follow with Primary MD [name], MD in 7 days  Please request your PCP  to go over your hospital tests, procedures, radiology results at the follow up. Please get your medicines reviewed and adjusted.  Your PCP may decide to repeat certain labs or tests as needed. Do not drive, operate heavy machinery, perform activities at heights, swimming or participation in water activities or provide baby sitting services if your were admitted for syncope or siezures until you have seen by Primary MD or a Neurologist and advised to do so again. [place] Controlled Substance Reporting System database was reviewed. Do not drive, operate heavy machinery, perform activities at heights, swim, participate in water activities or provide baby-sitting services while on medications for pain, sleep and mood until your outpatient physician has reevaluated you and  advised to do so again.  You are strongly recommended to comply with the dose, frequency and duration of prescribed medications. Activity: As tolerated with Full fall precautions use walker/cane & assistance as needed Avoid using any recreational substances like cigarette, tobacco, alcohol, or non-prescribed drug. If you experience worsening of your admission symptoms, develop shortness of breath, life threatening emergency, suicidal or homicidal thoughts you must seek medical attention immediately by calling 911 or calling your MD immediately  if symptoms less severe. You must read complete instructions/literature along with all the possible adverse reactions/side effects for all the medicines you take and that have been prescribed to you. Take any new medicine only after you have completely understood and accepted all the possible adverse reactions/side effects.  Wear Seat belts while driving. You were cared for by a hospitalist during your hospital stay. If you have any questions about your discharge medications or the care you received while you were in the hospital after you are  discharged, you can call the unit and ask to speak with the hospitalist or the covering physician. Once you are discharged, your primary care physician will handle any further medical issues. Please note that NO REFILLS for any discharge medications will be authorized once you are discharged, as it is imperative that you return to your primary care physician (or establish a relationship with a primary care physician if you do not have one).   Increase activity slowly   Complete by: As directed        Discharge Medications:   Allergies as of 02/08/2022       Reactions   Ciprofloxacin    Cause blisters on lips and face        Medication List     STOP taking these medications    Brilinta 90 MG Tabs tablet Generic drug: ticagrelor       TAKE these medications    amoxicillin-clavulanate 875-125 MG  tablet Commonly known as: AUGMENTIN Take 1 tablet by mouth 2 (two) times daily for 10 days.   atorvastatin 20 MG tablet Commonly known as: LIPITOR Take 20 mg by mouth daily.   carboxymethylcellulose 1 % ophthalmic solution Apply 1 drop to eye 3 (three) times daily for 14 days.   carvedilol 3.125 MG tablet Commonly known as: COREG Take 3.125 mg by mouth 2 (two) times daily with a meal.   clopidogrel 75 MG tablet Commonly known as: PLAVIX Take 75 mg by mouth daily.   Entresto 49-51 MG Generic drug: sacubitril-valsartan Take 0.5 tablets by mouth 2 (two) times daily.   furosemide 20 MG tablet Commonly known as: LASIX Take 20 mg by mouth daily.   isosorbide mononitrate 60 MG 24 hr tablet Commonly known as: IMDUR Take 60 mg by mouth daily.   levETIRAcetam 750 MG tablet Commonly known as: KEPPRA Take 1,500 mg by mouth 2 (two) times daily.   levothyroxine 75 MCG tablet Commonly known as: SYNTHROID Take 75 mcg by mouth daily.   nitroGLYCERIN 0.4 MG SL tablet Commonly known as: NITROSTAT Place 0.4 mg under the tongue every 5 (five) minutes as needed for chest pain.   omeprazole 40 MG capsule Commonly known as: PRILOSEC Take 40 mg by mouth daily.   ranolazine 1000 MG SR tablet Commonly known as: RANEXA Take 1,000 mg by mouth 2 (two) times daily.   Spiriva Respimat 2.5 MCG/ACT Aers Generic drug: Tiotropium Bromide Monohydrate Inhale 2 each into the lungs daily.   Testosterone 12.5 MG/ACT (1%) Gel Apply 2 Pump topically daily.   Xarelto 15 MG Tabs tablet Generic drug: Rivaroxaban Take 15 mg by mouth daily.         The results of significant diagnostics from this hospitalization (including imaging, microbiology, ancillary and laboratory) are listed below for reference.    Procedures and Diagnostic Studies:   CT Soft Tissue Neck W Contrast  Result Date: 02/08/2022 CLINICAL DATA:  Soft tissue infection suspected EXAM: CT NECK WITH CONTRAST TECHNIQUE:  Multidetector CT imaging of the neck was performed using the standard protocol following the bolus administration of intravenous contrast. RADIATION DOSE REDUCTION: This exam was performed according to the departmental dose-optimization program which includes automated exposure control, adjustment of the mA and/or kV according to patient size and/or use of iterative reconstruction technique. CONTRAST:  72m OMNIPAQUE IOHEXOL 350 MG/ML SOLN COMPARISON:  None Available. FINDINGS: Pharynx and larynx: Normal. No mass or swelling. Salivary glands: No inflammation, mass, or stone. Thyroid: Normal. Lymph nodes: Negative Vascular: Calcific aortic atherosclerosis and  carotid bifurcation atherosclerosis. Limited intracranial: Negative. Visualized orbits: Negative. Mastoids and visualized paranasal sinuses: Clear. Skeleton: No acute or aggressive process. Upper chest: Negative. Other: There is multifocal subcutaneous induration and edema of the right facial soft tissues, both in the right infraorbital area and superficial to the right mandible. No abscess or drainable fluid collection. No clear source. IMPRESSION: 1. Multifocal subcutaneous induration and edema of the right facial soft tissues, consistent with cellulitis. No abscess or drainable fluid collection. No clear source. Aortic Atherosclerosis (ICD10-I70.0). Electronically Signed   By: Ulyses Jarred M.D.   On: 02/08/2022 01:56   CT Angio Chest Pulmonary Embolism (PE) W or WO Contrast  Result Date: 02/08/2022 CLINICAL DATA:  Chest pain and shortness of breath. EXAM: CT ANGIOGRAPHY CHEST WITH CONTRAST TECHNIQUE: Multidetector CT imaging of the chest was performed using the standard protocol during bolus administration of intravenous contrast. Multiplanar CT image reconstructions and MIPs were obtained to evaluate the vascular anatomy. RADIATION DOSE REDUCTION: This exam was performed according to the departmental dose-optimization program which includes automated  exposure control, adjustment of the mA and/or kV according to patient size and/or use of iterative reconstruction technique. CONTRAST:  62m OMNIPAQUE IOHEXOL 350 MG/ML SOLN COMPARISON:  None Available. FINDINGS: Cardiovascular: There is a multi lead AICD. There is moderate severity calcification of the aortic arch. The ascending thoracic aorta measures 4.0 cm x 4.2 cm. Satisfactory opacification of the pulmonary arteries to the segmental level. No evidence of pulmonary embolism. There is mild cardiomegaly with marked severity coronary artery calcification. No pericardial effusion. Mediastinum/Nodes: No enlarged mediastinal, hilar, or axillary lymph nodes. Thyroid gland, trachea, and esophagus demonstrate no significant findings. Lungs/Pleura: Mild atelectasis is seen along the posterior aspects of the bilateral lower lobes. There is a small left pleural effusion. No pneumothorax is identified. Upper Abdomen: Noninflamed diverticula are seen along the splenic flexure. Musculoskeletal: Multiple sternal wires are noted. Multilevel degenerative changes seen throughout the thoracic spine. Review of the MIP images confirms the above findings. IMPRESSION: 1. No evidence of pulmonary embolism. 2. Mild bilateral lower lobe atelectasis with a small left pleural effusion. 3. Mild cardiomegaly with marked severity coronary artery calcification. 4. Colonic diverticulosis. 5. Aortic atherosclerosis. Aortic Atherosclerosis (ICD10-I70.0). Electronically Signed   By: TVirgina NorfolkM.D.   On: 02/08/2022 01:55   DG Chest Port 1 View  Result Date: 02/07/2022 CLINICAL DATA:  Chest pain, dyspnea EXAM: PORTABLE CHEST 1 VIEW COMPARISON:  12/28/2019 FINDINGS: Mild left basilar atelectasis. Lungs are otherwise clear. No pneumothorax or pleural effusion. Status post probable coronary artery bypass grafting. Mild cardiomegaly is stable. Right subclavian pacemaker defibrillator is unchanged. Pulmonary vascularity is normal. No acute  bone abnormality. IMPRESSION: 1. Mild left basilar atelectasis. Electronically Signed   By: AFidela SalisburyM.D.   On: 02/07/2022 23:34     Labs:   Basic Metabolic Panel: Recent Labs  Lab 02/07/22 2207  NA 134*  K 4.0  CL 100  CO2 23  GLUCOSE 123*  BUN 23  CREATININE 1.79*  CALCIUM 8.7*   GFR Estimated Creatinine Clearance: 35.5 mL/min (A) (by C-G formula based on SCr of 1.79 mg/dL (H)). Liver Function Tests: No results for input(s): "AST", "ALT", "ALKPHOS", "BILITOT", "PROT", "ALBUMIN" in the last 168 hours. No results for input(s): "LIPASE", "AMYLASE" in the last 168 hours. No results for input(s): "AMMONIA" in the last 168 hours. Coagulation profile Recent Labs  Lab 02/07/22 2207  INR 2.0*    CBC: Recent Labs  Lab 02/07/22 2207  WBC 10.9*  HGB 11.5*  HCT 34.5*  MCV 105.8*  PLT 102*   Cardiac Enzymes: No results for input(s): "CKTOTAL", "CKMB", "CKMBINDEX", "TROPONINI" in the last 168 hours. BNP: Invalid input(s): "POCBNP" CBG: No results for input(s): "GLUCAP" in the last 168 hours. D-Dimer No results for input(s): "DDIMER" in the last 72 hours. Hgb A1c No results for input(s): "HGBA1C" in the last 72 hours. Lipid Profile Recent Labs    02/08/22 0532  CHOL 134  HDL 41  LDLCALC 77  TRIG 81  CHOLHDL 3.3   Thyroid function studies No results for input(s): "TSH", "T4TOTAL", "T3FREE", "THYROIDAB" in the last 72 hours.  Invalid input(s): "FREET3" Anemia work up No results for input(s): "VITAMINB12", "FOLATE", "FERRITIN", "TIBC", "IRON", "RETICCTPCT" in the last 72 hours. Microbiology No results found for this or any previous visit (from the past 240 hour(s)).  Time coordinating discharge: 35 minutes  Signed: Martasia Talamante  [facility] 02/08/2022, 2:05 PM
